# Patient Record
Sex: Female | Born: 1985 | Race: Black or African American | Hispanic: No | Marital: Married | Smoking: Current every day smoker
Health system: Southern US, Community
[De-identification: ages and names within clinical notes are randomized; demographics above are authoritative.]

## PROBLEM LIST (undated history)

## (undated) DIAGNOSIS — L309 Dermatitis, unspecified: Secondary | ICD-10-CM

## (undated) DIAGNOSIS — I10 Essential (primary) hypertension: Secondary | ICD-10-CM

## (undated) HISTORY — PX: WISDOM TOOTH EXTRACTION: SHX21

---

## 2011-12-24 ENCOUNTER — Emergency Department (HOSPITAL_BASED_OUTPATIENT_CLINIC_OR_DEPARTMENT_OTHER)
Admission: EM | Admit: 2011-12-24 | Discharge: 2011-12-24 | Disposition: A | Discharge status: Home / Self Care | Payer: BC Managed Care – PPO | Attending: Emergency Medicine | Admitting: Emergency Medicine

## 2011-12-24 ENCOUNTER — Emergency Department (HOSPITAL_BASED_OUTPATIENT_CLINIC_OR_DEPARTMENT_OTHER): Payer: BC Managed Care – PPO

## 2011-12-24 ENCOUNTER — Encounter (HOSPITAL_BASED_OUTPATIENT_CLINIC_OR_DEPARTMENT_OTHER): Payer: Self-pay | Admitting: Emergency Medicine

## 2011-12-24 DIAGNOSIS — Z3490 Encounter for supervision of normal pregnancy, unspecified, unspecified trimester: Secondary | ICD-10-CM

## 2011-12-24 DIAGNOSIS — O2690 Pregnancy related conditions, unspecified, unspecified trimester: Secondary | ICD-10-CM | POA: Insufficient documentation

## 2011-12-24 DIAGNOSIS — R1032 Left lower quadrant pain: Secondary | ICD-10-CM | POA: Insufficient documentation

## 2011-12-24 DIAGNOSIS — R109 Unspecified abdominal pain: Secondary | ICD-10-CM

## 2011-12-24 DIAGNOSIS — O210 Mild hyperemesis gravidarum: Secondary | ICD-10-CM | POA: Insufficient documentation

## 2011-12-24 HISTORY — DX: Dermatitis, unspecified: L30.9

## 2011-12-24 LAB — URINALYSIS, ROUTINE W REFLEX MICROSCOPIC
Leukocytes, UA: NEGATIVE
Nitrite: NEGATIVE
Protein, ur: NEGATIVE mg/dL
Specific Gravity, Urine: 1.02 (ref 1.005–1.030)
Urobilinogen, UA: 1 mg/dL (ref 0.0–1.0)

## 2011-12-24 LAB — CBC
MCHC: 35.8 g/dL (ref 30.0–36.0)
Platelets: 206 10*3/uL (ref 150–400)
RDW: 12.1 % (ref 11.5–15.5)
WBC: 6.1 10*3/uL (ref 4.0–10.5)

## 2011-12-24 LAB — URINE MICROSCOPIC-ADD ON

## 2011-12-24 LAB — ABO/RH
ABO/RH(D): AB NEG
Antibody Screen: NEGATIVE

## 2011-12-24 LAB — WET PREP, GENITAL

## 2011-12-24 LAB — HCG, QUANTITATIVE, PREGNANCY: hCG, Beta Chain, Quant, S: 50307 m[IU]/mL — ABNORMAL HIGH (ref <5)

## 2011-12-24 MED ORDER — ONDANSETRON 8 MG PO TBDP
8.0000 mg | ORAL_TABLET | Freq: Once | ORAL | Status: AC
  Administered 2011-12-24: 8 mg via ORAL
  Filled 2011-12-24: qty 1

## 2011-12-24 NOTE — ED Notes (Signed)
[redacted] weeks pregnant  Cramping on left side of abd

## 2011-12-24 NOTE — ED Provider Notes (Addendum)
History     CSN: [id number]  Arrival date & time 12/24/11  2028   First MD Initiated Contact with Patient 12/24/11 2056      Chief Complaint  Patient presents with  . Abdominal Cramping    (Consider location/radiation/quality/duration/timing/severity/associated sxs/prior treatment) HPI Comments: Patient presents with left lower quadrant pain that's been going on since approximately 6 AM this morning.  Patient has some associated nausea.  She has normal bowel movements, no dysuria, no hematuria and no vaginal bleeding or vaginal discharge.  Patient does note that she believes she is approximately [redacted] weeks pregnant with her last menstrual period being approximately April 15.  This is the patient's third pregnancy and she had one D&C and one carried to full term.  No prior ectopic pregnancies.  No fevers.  Patient is a 26 y.o. female presenting with cramps. The history is provided by the patient. No language interpreter was used.  Abdominal Cramping The primary symptoms of the illness include abdominal pain and nausea. The primary symptoms of the illness do not include fever, fatigue, shortness of breath, vomiting, diarrhea, hematemesis, hematochezia, dysuria, vaginal discharge or vaginal bleeding. The current episode started 13 to 24 hours ago. The onset of the illness was gradual. The problem has not changed since onset. Symptoms associated with the illness do not include chills or back pain.    Past Medical History  Diagnosis Date  . Eczema     History reviewed. No pertinent past surgical history.  History reviewed. No pertinent family history.  History  Substance Use Topics  . Smoking status: Never Smoker   . Smokeless tobacco: Not on file  . Alcohol Use: No    OB History    Grav Para Term Preterm Abortions TAB SAB Ect Mult Living   3 1   1            Review of Systems  Constitutional: Negative.  Negative for fever, chills and fatigue.  HENT: Negative.   Eyes: Negative.   Negative for discharge and redness.  Respiratory: Negative.  Negative for cough and shortness of breath.   Cardiovascular: Negative.  Negative for chest pain.  Gastrointestinal: Positive for nausea and abdominal pain. Negative for vomiting, diarrhea, hematochezia and hematemesis.  Genitourinary: Negative.  Negative for dysuria, vaginal bleeding and vaginal discharge.  Musculoskeletal: Negative.  Negative for back pain.  Skin: Negative.  Negative for color change and rash.  Neurological: Negative.  Negative for syncope and headaches.  Hematological: Negative.  Negative for adenopathy.  Psychiatric/Behavioral: Negative.  Negative for confusion.  All other systems reviewed and are negative.    Allergies  Review of patient's allergies indicates no known allergies.  Home Medications  No current outpatient prescriptions on file.  BP 139/90  Pulse 95  Temp(Src) 98.7 F (37.1 C) (Oral)  Resp 18  Ht 5\' 3"  (1.6 m)  Wt 133 lb (60.328 kg)  BMI 23.56 kg/m2  SpO2 100%  Physical Exam  Nursing note and vitals reviewed. Constitutional: She is oriented to person, place, and time. She appears well-developed and well-nourished.  Non-toxic appearance. She does not have a sickly appearance.  HENT:  Head: Normocephalic and atraumatic.  Eyes: Conjunctivae, EOM and lids are normal. Pupils are equal, round, and reactive to light. No scleral icterus.  Neck: Trachea normal and normal range of motion. Neck supple.  Cardiovascular: Normal rate, regular rhythm and normal heart sounds.   Pulmonary/Chest: Effort normal and breath sounds normal. No respiratory distress. She has no wheezes. She  has no rales.  Abdominal: Soft. Normal appearance. There is no tenderness. There is no rebound, no guarding and no CVA tenderness.  Genitourinary:       Examination chaperoned by Harvin Hazel, RN.  Normal external genitalia without lesions or rashes.  No blood present in the vaginal vault.  There is a thick white discharge  present but no signs of inflammation.   Musculoskeletal: Normal range of motion.  Neurological: She is alert and oriented to person, place, and time. She has normal strength.  Skin: Skin is warm, dry and intact. No rash noted.  Psychiatric: She has a normal mood and affect. Her behavior is normal. Judgment and thought content normal.    ED Course  Procedures (including critical care time)  Results for orders placed during the hospital encounter of 12/24/11  PREGNANCY, URINE      Component Value Range   Preg Test, Ur POSITIVE (*) NEGATIVE   URINALYSIS, ROUTINE W REFLEX MICROSCOPIC      Component Value Range   Color, Urine YELLOW  YELLOW    APPearance CLOUDY (*) CLEAR    Specific Gravity, Urine 1.020  1.005 - 1.030    pH 6.0  5.0 - 8.0    Glucose, UA NEGATIVE  NEGATIVE (mg/dL)   Hgb urine dipstick SMALL (*) NEGATIVE    Bilirubin Urine NEGATIVE  NEGATIVE    Ketones, ur NEGATIVE  NEGATIVE (mg/dL)   Protein, ur NEGATIVE  NEGATIVE (mg/dL)   Urobilinogen, UA 1.0  0.0 - 1.0 (mg/dL)   Nitrite NEGATIVE  NEGATIVE    Leukocytes, UA NEGATIVE  NEGATIVE   HCG, QUANTITATIVE, PREGNANCY      Component Value Range   hCG, Beta Chain, Quant, S 40981 (*) <5 (mIU/mL)  CBC      Component Value Range   WBC 6.1  4.0 - 10.5 (K/uL)   RBC 4.24  3.87 - 5.11 (MIL/uL)   Hemoglobin 13.4  12.0 - 15.0 (g/dL)   HCT 19.1  47.8 - 29.5 (%)   MCV 88.2  78.0 - 100.0 (fL)   MCH 31.6  26.0 - 34.0 (pg)   MCHC 35.8  30.0 - 36.0 (g/dL)   RDW 62.1  30.8 - 65.7 (%)   Platelets 206  150 - 400 (K/uL)  URINE MICROSCOPIC-ADD ON      Component Value Range   Squamous Epithelial / LPF FEW (*) RARE    RBC / HPF 3-6  <3 (RBC/hpf)   Bacteria, UA RARE  RARE    Urine-Other MUCOUS PRESENT     US Ob Comp Less 14 Wks  12/24/2011  *RADIOLOGY REPORT*  Clinical Data: Pregnant with lower abdominal pain.  OBSTETRIC <14 WK [place] AND TRANSVAGINAL OB US  Technique:  Both transabdominal and transvaginal ultrasound examinations were  performed for complete evaluation of the gestation as well as the maternal uterus, adnexal regions, and pelvic cul-de-sac.  Transvaginal technique was performed to assess early pregnancy.  Comparison:  No priors.  Intrauterine gestational sac:  A gestational sac is present in the fundal portion of the endometrial cavityand is ovoid. Yolk sac: Present. Embryo: Present. Cardiac Activity: Present. Heart Rate: 169 bpm  CRL: 16.3   mm  8   w  0   d        [place] EDC: 08/04/2012  Maternal uterus/adnexae: The right ovary measures 3.2 x 2.2 x 1.9 cm and is normal in echotexture appearance.  The left ovary measures 4.5 x 3.1 x 2.7 cm and is normal in echotexture  appearance.  No free fluid within the cul-de-sac.  IMPRESSION: 1. Single viable IUP with fetal heart rate of 169 beats per minute and estimated gestational age of approximately 8 weeks and 0 days. 2.  No acute findings.  Original Report Authenticated By: Florencia Reasons, M.D.      MDM  Patient with left lower quadrant pain with a positive pregnancy test at home.  We will perform the evaluation to rule out an ectopic pregnancy at this time.  Patient believes she may be Rh- so I also make sure to a pelvic exam to assess for signs of bleeding despite the fact that patient has not noted any bleeding.        Nat Christen, MD 12/24/11 2112  Patient with a normal IUP seen on ultrasound.  She had no history for vaginal bleeding at home and no vaginal bleeding a pelvic exam here.  Given there is no bleeding I feel patient can be discharged prior to her ABO type returning since she would not require RhoGAM.  I have instructed the patient that if she does develop bleeding she should followup with her OB/GYN.  She does have an appointment scheduled for next Friday.  Nat Christen, MD 12/24/11 [phone]
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
## 2011-12-25 LAB — GC/CHLAMYDIA PROBE AMP, GENITAL
Chlamydia, DNA Probe: NEGATIVE
GC Probe Amp, Genital: NEGATIVE

## 2012-07-03 ENCOUNTER — Encounter (HOSPITAL_BASED_OUTPATIENT_CLINIC_OR_DEPARTMENT_OTHER): Payer: Self-pay | Admitting: *Deleted

## 2012-07-03 ENCOUNTER — Emergency Department (HOSPITAL_BASED_OUTPATIENT_CLINIC_OR_DEPARTMENT_OTHER)
Admission: EM | Admit: 2012-07-03 | Discharge: 2012-07-04 | Disposition: A | Discharge status: Other Acute Inpatient Hospital | Payer: BC Managed Care – PPO | Attending: Emergency Medicine | Admitting: Emergency Medicine

## 2012-07-03 DIAGNOSIS — Z3490 Encounter for supervision of normal pregnancy, unspecified, unspecified trimester: Secondary | ICD-10-CM

## 2012-07-03 DIAGNOSIS — L259 Unspecified contact dermatitis, unspecified cause: Secondary | ICD-10-CM | POA: Insufficient documentation

## 2012-07-03 DIAGNOSIS — O4290 Premature rupture of membranes, unspecified as to length of time between rupture and onset of labor, unspecified weeks of gestation: Secondary | ICD-10-CM

## 2012-07-03 DIAGNOSIS — R109 Unspecified abdominal pain: Secondary | ICD-10-CM | POA: Insufficient documentation

## 2012-07-03 LAB — URINALYSIS, ROUTINE W REFLEX MICROSCOPIC
Glucose, UA: NEGATIVE mg/dL
Glucose, UA: NEGATIVE mg/dL
Ketones, ur: NEGATIVE mg/dL
Leukocytes, UA: NEGATIVE
Protein, ur: NEGATIVE mg/dL
Urobilinogen, UA: 1 mg/dL (ref 0.0–1.0)
pH: 7.5 (ref 5.0–8.0)

## 2012-07-03 LAB — URINE MICROSCOPIC-ADD ON

## 2012-07-03 LAB — WET PREP, GENITAL: Yeast Wet Prep HPF POC: NONE SEEN

## 2012-07-03 MED ORDER — SODIUM CHLORIDE 0.9 % IV BOLUS (SEPSIS)
500.0000 mL | Freq: Once | INTRAVENOUS | Status: AC
  Administered 2012-07-03: 500 mL via INTRAVENOUS

## 2012-07-03 MED ORDER — SODIUM CHLORIDE 0.9 % IV SOLN
INTRAVENOUS | Status: DC
  Administered 2012-07-03: 23:00:00 via INTRAVENOUS

## 2012-07-03 NOTE — ED Notes (Signed)
[facility] called for transport to [facility], RN notified.

## 2012-07-03 NOTE — Progress Notes (Signed)
Per ED RN, Pt presents to ED c/o pressure and swollen vaginal area. Pt denies UCs ut states some [employer]. Monitor in place, FHR tracing reassuring. Suggest EDP assess pt and recommend follow up to physicians in [place] where she has prenatal care if no labor.

## 2012-07-03 NOTE — ED Provider Notes (Signed)
History    This chart was scribed for Hurman Horn, MD, MD by Smitty Pluck, ED Scribe. The patient was seen in room MHT14 and the patient's care was started at 10:01PM.   CSN: [id number]  Arrival date & time 07/03/12  2138    OB [place] at [facility]    Chief Complaint  Patient presents with  . pregnant-abdominal pain     (Consider location/radiation/quality/duration/timing/severity/associated sxs/prior treatment) The history is provided by the patient. No language interpreter was used.   Gabriella Monroe is a 26 y.o. female who presents to the Emergency Department complaining of swelling and tenderness of vaginal skin with scant white discharge all day onset today. Pt is G3P1AB1 EDC 09Jan [redacted] weeks pregnant. Pt reports having constant abdominal pressure 24/7 that was been ongoing for past 2-3 weeks. She has been seen by OB and they are aware of that pressure. Pt denies vaginal bleeding, cough, chest pain, SOB, emesis, nausea, diarrhea, fever, chills, abdominal cramping or back pain and any other pain. Pt reports she is gravida 3 para 1 abortus 1.   OB is at [facility]  Past Medical History  Diagnosis Date  . Eczema     History reviewed. No pertinent past surgical history.  History reviewed. No pertinent family history.  History  Substance Use Topics  . Smoking status: Never Smoker   . Smokeless tobacco: Not on file  . Alcohol Use: No    OB History    Grav Para Term Preterm Abortions TAB SAB Ect Mult Living   3 1   1            Review of Systems 10 Systems reviewed and all are negative for acute change except as noted in the HPI.   Allergies  Review of patient's allergies indicates no known allergies.  Home Medications   Current Outpatient Rx  Name  Route  Sig  Dispense  Refill  . FLUOCINONIDE 0.05 % EX GEL   Topical   Apply 1 application topically 2 (two) times daily. Patient uses this medication for eczema. .         . TRIAMCINOLONE  ACETONIDE 0.1 % EX CREA   Topical   Apply 1 application topically 2 (two) times daily. Patient used this medication for eczema.           BP 123/82  Pulse 102  Temp 98.7 F (37.1 C) (Oral)  Resp 20  Ht 5\' 3"  (1.6 m)  Wt 156 lb (70.761 kg)  BMI 27.63 kg/m2  SpO2 100%  LMP 11/03/2011  Physical Exam  Nursing note and vitals reviewed. Constitutional:       Awake, alert, nontoxic appearance.  HENT:  Head: Atraumatic.  Eyes: Right eye exhibits no discharge. Left eye exhibits no discharge.  Neck: Neck supple.  Pulmonary/Chest: Effort normal. She exhibits no tenderness.  Abdominal: Soft. There is no tenderness. There is no rebound.  Genitourinary:       Mucosal aspect of labial folds are tender with erythema White vaginal discharge present  Chaperone present for bimanual exam Cervix is soft Cervix is closed  White discharge is positive ferning    Musculoskeletal: She exhibits no tenderness.       Baseline ROM, no obvious new focal weakness.  Neurological:       Mental status and motor strength appears baseline for patient and situation.  Skin: No rash noted.  Psychiatric: She has a normal mood and affect.    ED Course  Procedures (including critical care time) DIAGNOSTIC STUDIES: Oxygen Saturation is 100% on room air, normal by my interpretation.    COORDINATION OF CARE: 10:06 PMPatient / Family / Caregiver understand and agree with initial ED impression and plan with expectations set for ED visit.  Initial U/A contaminated so cath ordered; no obvious UTI via cath specimen.  D/w [facility] on-call for Pt at [facility], d/w [facility] ED doc at [facility], Pt accepted will reg in ED at [facility] for eval. at L&D.  [facility] L&D reported no contractions on monitor and FHR 150s.  Labs Reviewed  URINALYSIS, ROUTINE W REFLEX MICROSCOPIC - Abnormal; Notable for the following:    APPearance CLOUDY (*)     Hgb urine dipstick TRACE (*)     Leukocytes, UA MODERATE (*)     All other components within  normal limits  URINE MICROSCOPIC-ADD ON - Abnormal; Notable for the following:    Squamous Epithelial / LPF MANY (*)     Bacteria, UA MANY (*)     All other components within normal limits  WET PREP, GENITAL - Abnormal; Notable for the following:    WBC, Wet Prep HPF POC MODERATE (*)  MANY BACTERIA SEEN   All other components within normal limits  URINALYSIS, ROUTINE W REFLEX MICROSCOPIC - Abnormal; Notable for the following:    APPearance CLOUDY (*)     Hgb urine dipstick TRACE (*)     All other components within normal limits  URINE MICROSCOPIC-ADD ON - Abnormal; Notable for the following:    Squamous Epithelial / LPF FEW (*)     All other components within normal limits  URINE CULTURE   No results found.   1. PROM (premature rupture of membranes)   2. Pregnancy       MDM  The patient appears reasonably stabilized for transfer considering the current resources, flow, and capabilities available in the ED at this time, and I doubt any other [facility] requiring further screening and/or treatment in the ED prior to transfer.      I personally performed the services described in this documentation, which was scribed in my presence. The recorded information has been reviewed and is accurate.    Hurman Horn, MD 07/03/12 [phone]

## 2012-07-03 NOTE — ED Notes (Signed)
Report given to Shawn at [facility]

## 2012-07-03 NOTE — ED Notes (Addendum)
Pt is [redacted] weeks pregnant G3P1 and states she has been feeling "pressure" in her lower abd since this a.m. Area "swollen and tender to touch" Goes to [employer]

## 2012-07-03 NOTE — ED Notes (Signed)
MD at bedside. 

## 2012-07-04 NOTE — ED Notes (Addendum)
Pt removed from the fetal monitor and [facility] notified.

## 2012-07-04 NOTE — ED Notes (Signed)
NS fluids infusing at 17ml/hr on transfer to [place] [facility]

## 2012-07-04 NOTE — ED Notes (Signed)
Pt stable  when transferred to [facility]

## 2012-07-04 NOTE — ED Notes (Signed)
[facility] here to transport pt.   

## 2012-07-05 LAB — URINE CULTURE: Colony Count: 4000

## 2012-11-19 ENCOUNTER — Encounter (HOSPITAL_BASED_OUTPATIENT_CLINIC_OR_DEPARTMENT_OTHER): Payer: Self-pay | Admitting: Family Medicine

## 2012-11-19 ENCOUNTER — Emergency Department (HOSPITAL_BASED_OUTPATIENT_CLINIC_OR_DEPARTMENT_OTHER)
Admission: EM | Admit: 2012-11-19 | Discharge: 2012-11-19 | Disposition: A | Discharge status: Home / Self Care | Payer: Managed Care, Other (non HMO) | Attending: Emergency Medicine | Admitting: Emergency Medicine

## 2012-11-19 DIAGNOSIS — S0502XA Injury of conjunctiva and corneal abrasion without foreign body, left eye, initial encounter: Secondary | ICD-10-CM

## 2012-11-19 DIAGNOSIS — Y929 Unspecified place or not applicable: Secondary | ICD-10-CM | POA: Insufficient documentation

## 2012-11-19 DIAGNOSIS — Y939 Activity, unspecified: Secondary | ICD-10-CM | POA: Insufficient documentation

## 2012-11-19 DIAGNOSIS — Z872 Personal history of diseases of the skin and subcutaneous tissue: Secondary | ICD-10-CM | POA: Insufficient documentation

## 2012-11-19 DIAGNOSIS — S058X9A Other injuries of unspecified eye and orbit, initial encounter: Secondary | ICD-10-CM | POA: Insufficient documentation

## 2012-11-19 DIAGNOSIS — IMO0002 Reserved for concepts with insufficient information to code with codable children: Secondary | ICD-10-CM | POA: Insufficient documentation

## 2012-11-19 MED ORDER — TETRACAINE HCL 0.5 % OP SOLN
OPHTHALMIC | Status: AC
  Administered 2012-11-19: 10:00:00
  Filled 2012-11-19: qty 2

## 2012-11-19 MED ORDER — HYDROCODONE-ACETAMINOPHEN 5-325 MG PO TABS: 2.0000 | ORAL_TABLET | Freq: Four times a day (QID) | ORAL | Status: DC | PRN

## 2012-11-19 MED ORDER — SULFACETAMIDE SODIUM 10 % OP SOLN
2.0000 [drp] | OPHTHALMIC | Status: DC
  Administered 2012-11-19: 2 [drp] via OPHTHALMIC
  Filled 2012-11-19: qty 15

## 2012-11-19 MED ORDER — FLUORESCEIN SODIUM 1 MG OP STRP
ORAL_STRIP | OPHTHALMIC | Status: AC
  Administered 2012-11-19: 10:00:00
  Filled 2012-11-19: qty 1

## 2012-11-19 NOTE — ED Notes (Signed)
Pt sts baby "poked" her in left eye yesterday. Pt c/o left eyelid "sore" and drainage. Pt sts vision is normal.

## 2012-11-19 NOTE — ED Notes (Signed)
MD at bedside. 

## 2012-11-19 NOTE — ED Provider Notes (Signed)
History     CSN: [id number]  Arrival date & time 11/19/12  4696   First MD Initiated Contact with Patient 11/19/12 [phone]      Chief Complaint  Patient presents with  . Eye Injury    (Consider location/radiation/quality/duration/timing/severity/associated sxs/prior treatment) Patient is a 28 y.o. female presenting with eye injury.  Eye Injury   Pt reports yesterday evening her infant poked her in the left eye. She has had moderate aching pain under the eye lid since then, worse with blinking and clear drainage. No pus, no blurry vision.   Past Medical History  Diagnosis Date  . Eczema     History reviewed. No pertinent past surgical history.  No family history on file.  History  Substance Use Topics  . Smoking status: Never Smoker   . Smokeless tobacco: Not on file  . Alcohol Use: No    OB History   Grav Para Term Preterm Abortions TAB SAB Ect Mult Living   3 1   1            Review of Systems All other systems reviewed and are negative except as noted in HPI.   Allergies  Review of patient's allergies indicates no known allergies.  Home Medications   Current Outpatient Rx  Name  Route  Sig  Dispense  Refill  . fluocinonide gel (LIDEX) 0.05 %   Topical   Apply 1 application topically 2 (two) times daily. Patient uses this medication for eczema. .         . triamcinolone cream (KENALOG) 0.1 %   Topical   Apply 1 application topically 2 (two) times daily. Patient used this medication for eczema.           BP 136/89  Pulse 91  Temp(Src) 98.1 F (36.7 C) (Oral)  Resp 18  LMP 11/02/2012  Breastfeeding? Unknown  Physical Exam  Nursing note and vitals reviewed. Constitutional: She is oriented to person, place, and time. She appears well-developed and well-nourished.  HENT:  Head: Normocephalic and atraumatic.  Eyes: Conjunctivae and EOM are normal. Pupils are equal, round, and reactive to light. Right eye exhibits no discharge. Left eye exhibits  no discharge.  No pain with consensual pupillary response, anterior chambers clear. There is a small corneal abrasion on the left at the 3 o'clock position  Neck: Normal range of motion. Neck supple.  Cardiovascular: Normal rate, normal heart sounds and intact distal pulses.   Pulmonary/Chest: Effort normal and breath sounds normal.  Abdominal: Bowel sounds are normal. She exhibits no distension. There is no tenderness.  Musculoskeletal: Normal range of motion. She exhibits no edema and no tenderness.  Neurological: She is alert and oriented to person, place, and time. She has normal strength. No cranial nerve deficit or sensory deficit.  Skin: Skin is warm and dry. No rash noted.  Psychiatric: She has a normal mood and affect.    ED Course  Procedures (including critical care time)  Labs Reviewed - No data to display No results found.   1. Corneal abrasion, left, initial encounter       MDM  Abx drops, pain meds for small uncomplicated corneal abrasion.         Keyerra Lamere B. Bernette Mayers, MD 11/19/12 [phone]

## 2013-03-31 ENCOUNTER — Encounter (HOSPITAL_BASED_OUTPATIENT_CLINIC_OR_DEPARTMENT_OTHER): Payer: Self-pay | Admitting: *Deleted

## 2013-03-31 ENCOUNTER — Emergency Department (HOSPITAL_BASED_OUTPATIENT_CLINIC_OR_DEPARTMENT_OTHER)
Admission: EM | Admit: 2013-03-31 | Discharge: 2013-03-31 | Disposition: A | Discharge status: Home / Self Care | Payer: Managed Care, Other (non HMO) | Attending: Emergency Medicine | Admitting: Emergency Medicine

## 2013-03-31 ENCOUNTER — Emergency Department (HOSPITAL_BASED_OUTPATIENT_CLINIC_OR_DEPARTMENT_OTHER): Payer: Managed Care, Other (non HMO)

## 2013-03-31 DIAGNOSIS — R071 Chest pain on breathing: Secondary | ICD-10-CM | POA: Insufficient documentation

## 2013-03-31 DIAGNOSIS — R0789 Other chest pain: Secondary | ICD-10-CM

## 2013-03-31 DIAGNOSIS — Z79899 Other long term (current) drug therapy: Secondary | ICD-10-CM | POA: Insufficient documentation

## 2013-03-31 DIAGNOSIS — Z872 Personal history of diseases of the skin and subcutaneous tissue: Secondary | ICD-10-CM | POA: Insufficient documentation

## 2013-03-31 LAB — BASIC METABOLIC PANEL
BUN: 13 mg/dL (ref 6–23)
CO2: 27 mEq/L (ref 19–32)
Chloride: 104 mEq/L (ref 96–112)
Glucose, Bld: 128 mg/dL — ABNORMAL HIGH (ref 70–99)
Potassium: 3.5 mEq/L (ref 3.5–5.1)

## 2013-03-31 LAB — CBC WITH DIFFERENTIAL/PLATELET
HCT: 39.2 % (ref 36.0–46.0)
Hemoglobin: 13.4 g/dL (ref 12.0–15.0)
Lymphs Abs: 1.4 10*3/uL (ref 0.7–4.0)
MCH: 31.6 pg (ref 26.0–34.0)
Monocytes Relative: 8 % (ref 3–12)
Neutro Abs: 2.2 10*3/uL (ref 1.7–7.7)
Neutrophils Relative %: 55 % (ref 43–77)
RBC: 4.24 MIL/uL (ref 3.87–5.11)

## 2013-03-31 MED ORDER — TRAMADOL HCL 50 MG PO TABS: 50.0000 mg | ORAL_TABLET | Freq: Four times a day (QID) | ORAL | Status: DC | PRN

## 2013-03-31 NOTE — ED Provider Notes (Signed)
CSN: [id number]     Arrival date & time 03/31/13  1854 History   First MD Initiated Contact with Patient 03/31/13 1914     Chief Complaint  Patient presents with  . Chest Pain   (Consider location/radiation/quality/duration/timing/severity/associated sxs/prior Treatment) HPI Comments: Pt states that she has been having substernal pain for the last 4 days:pt states that she figured it was related to the heavy lifting she does at her job, but when [employer] didn't help she decided to come have it evaluated:denies fever cough, vomiting:pt states that she quit smoking when all this started  Patient is a 27 y.o. female presenting with chest pain. The history is provided by the patient. No language interpreter was used.  Chest Pain Pain location:  Substernal area Pain quality: aching   Pain radiates to:  Does not radiate Pain radiates to the back: no   Pain severity:  Mild Onset quality:  Gradual Timing:  Constant Progression:  Unchanged Chronicity:  New Relieved by:  Nothing Worsened by:  Nothing tried Associated symptoms: no shortness of breath     Past Medical History  Diagnosis Date  . Eczema    History reviewed. No pertinent past surgical history. No family history on file. History  Substance Use Topics  . Smoking status: Never Smoker   . Smokeless tobacco: Not on file  . Alcohol Use: No   OB History   Grav Para Term Preterm Abortions TAB SAB Ect Mult Living   3 1   1           Review of Systems  Constitutional: Negative.   Respiratory: Negative for shortness of breath.   Cardiovascular: Positive for chest pain.    Allergies  Review of patient's allergies indicates no known allergies.  Home Medications   Current Outpatient Rx  Name  Route  Sig  Dispense  Refill  . fluocinonide gel (LIDEX) 0.05 %   Topical   Apply 1 application topically 2 (two) times daily. Patient uses this medication for eczema. .         . HYDROcodone-acetaminophen (NORCO/VICODIN) 5-325 MG  per tablet   Oral   Take 2 tablets by mouth every 6 (six) hours as needed for pain.   30 tablet   0   . triamcinolone cream (KENALOG) 0.1 %   Topical   Apply 1 application topically 2 (two) times daily. Patient used this medication for eczema.          BP 142/94  Pulse 88  Temp(Src) 98.2 F (36.8 C) (Oral)  Resp 18  Ht 5\' 3"  (1.6 m)  Wt 146 lb (66.225 kg)  BMI 25.87 kg/m2  SpO2 98%  LMP 03/29/2013 Physical Exam  Constitutional: She is oriented to person, place, and time. She appears well-developed and well-nourished.  HENT:  Head: Normocephalic and atraumatic.  Eyes: Conjunctivae and EOM are normal. Pupils are equal, round, and reactive to light.  Neck: Normal range of motion. Neck supple.  Cardiovascular: Regular rhythm.   Pulmonary/Chest: Effort normal and breath sounds normal. She exhibits no tenderness.  Musculoskeletal: Normal range of motion.  Neurological: She is alert and oriented to person, place, and time.  Skin: Skin is warm and dry.    ED Course  Procedures (including critical care time) Labs Review Labs Reviewed  BASIC METABOLIC PANEL - Abnormal; Notable for the following:    Glucose, Bld 128 (*)    All other components within normal limits  CBC WITH DIFFERENTIAL    Date: 03/31/2013  Rate: 91  Rhythm: normal sinus rhythm  QRS Axis: normal  Intervals: normal  ST/T Wave abnormalities: normal  Conduction Disutrbances:none  Narrative Interpretation:   Old EKG Reviewed: none available   Imaging Review Dg Chest 2 View  03/31/2013   *RADIOLOGY REPORT*  Clinical Data: Dull chest pain for 5 days  CHEST - 2 VIEW  Comparison: None.  Findings:  Normal cardiac silhouette and mediastinal contours.  No focal parenchymal opacity.  No pleural effusion or pneumothorax.  No evidence of edema.  No acute osseous abnormality. There is mild scoliotic curvature of the thoracolumbar spine, possibly positional.  IMPRESSION: No acute cardiopulmonary disease.   Original  Report Authenticated By: Tacey Ruiz, MD    MDM   1. Chest wall pain    Doubt cardiac in nature;vital area stable doubt pe:no infection noted on x-ray:pt is okay to follow up with pcp    Teressa Lower, NP 03/31/13 2028

## 2013-03-31 NOTE — ED Provider Notes (Signed)
Medical screening examination/treatment/procedure(s) were performed by non-physician practitioner and as supervising physician I was immediately available for consultation/collaboration.   Angellina Ferdinand, MD 03/31/13 2235 

## 2013-03-31 NOTE — ED Notes (Signed)
Pt placed on heart monitor.

## 2013-03-31 NOTE — ED Notes (Signed)
Pt reports dull ache to centralized chest since Sunday, originally thought it was work related, she works at [employer] and thought pain was from lifting, describes as dull ache, no radiation, no sob, no diaphoresis, + nausea,

## 2013-03-31 NOTE — ED Notes (Signed)
Dull pain in her chest x 4 days. Has been lifting at work. No relief with Aleve.

## 2014-02-05 ENCOUNTER — Encounter (HOSPITAL_BASED_OUTPATIENT_CLINIC_OR_DEPARTMENT_OTHER): Payer: Self-pay | Admitting: Emergency Medicine

## 2014-02-05 ENCOUNTER — Emergency Department (HOSPITAL_BASED_OUTPATIENT_CLINIC_OR_DEPARTMENT_OTHER)
Admission: EM | Admit: 2014-02-05 | Discharge: 2014-02-05 | Disposition: A | Discharge status: Home / Self Care | Payer: Managed Care, Other (non HMO) | Attending: Emergency Medicine | Admitting: Emergency Medicine

## 2014-02-05 DIAGNOSIS — IMO0001 Reserved for inherently not codable concepts without codable children: Secondary | ICD-10-CM

## 2014-02-05 DIAGNOSIS — R030 Elevated blood-pressure reading, without diagnosis of hypertension: Secondary | ICD-10-CM | POA: Insufficient documentation

## 2014-02-05 DIAGNOSIS — B9689 Other specified bacterial agents as the cause of diseases classified elsewhere: Secondary | ICD-10-CM | POA: Insufficient documentation

## 2014-02-05 DIAGNOSIS — Z872 Personal history of diseases of the skin and subcutaneous tissue: Secondary | ICD-10-CM | POA: Insufficient documentation

## 2014-02-05 DIAGNOSIS — Z3202 Encounter for pregnancy test, result negative: Secondary | ICD-10-CM | POA: Insufficient documentation

## 2014-02-05 DIAGNOSIS — A499 Bacterial infection, unspecified: Secondary | ICD-10-CM | POA: Insufficient documentation

## 2014-02-05 DIAGNOSIS — IMO0002 Reserved for concepts with insufficient information to code with codable children: Secondary | ICD-10-CM | POA: Insufficient documentation

## 2014-02-05 DIAGNOSIS — N760 Acute vaginitis: Secondary | ICD-10-CM | POA: Insufficient documentation

## 2014-02-05 LAB — CBC WITH DIFFERENTIAL/PLATELET
BASOS PCT: 1 % (ref 0–1)
Basophils Absolute: 0 10*3/uL (ref 0.0–0.1)
EOS ABS: 0.3 10*3/uL (ref 0.0–0.7)
Eosinophils Relative: 8 % — ABNORMAL HIGH (ref 0–5)
HEMATOCRIT: 41.1 % (ref 36.0–46.0)
HEMOGLOBIN: 14.2 g/dL (ref 12.0–15.0)
LYMPHS ABS: 1.4 10*3/uL (ref 0.7–4.0)
Lymphocytes Relative: 33 % (ref 12–46)
MCH: 31.8 pg (ref 26.0–34.0)
MCHC: 34.5 g/dL (ref 30.0–36.0)
MCV: 91.9 fL (ref 78.0–100.0)
MONO ABS: 0.3 10*3/uL (ref 0.1–1.0)
MONOS PCT: 8 % (ref 3–12)
NEUTROS ABS: 2.2 10*3/uL (ref 1.7–7.7)
NEUTROS PCT: 51 % (ref 43–77)
Platelets: 214 10*3/uL (ref 150–400)
RBC: 4.47 MIL/uL (ref 3.87–5.11)
RDW: 12.8 % (ref 11.5–15.5)
WBC: 4.2 10*3/uL (ref 4.0–10.5)

## 2014-02-05 LAB — CREATININE, SERUM
CREATININE: 0.8 mg/dL (ref 0.50–1.10)
GFR calc Af Amer: >90 mL/min (ref >90)
GFR calc non Af Amer: >90 mL/min (ref >90)

## 2014-02-05 LAB — URINALYSIS, ROUTINE W REFLEX MICROSCOPIC
BILIRUBIN URINE: NEGATIVE
Glucose, UA: NEGATIVE mg/dL
KETONES UR: NEGATIVE mg/dL
LEUKOCYTES UA: NEGATIVE
NITRITE: NEGATIVE
PROTEIN: NEGATIVE mg/dL
Specific Gravity, Urine: 1.026 (ref 1.005–1.030)
Urobilinogen, UA: 1 mg/dL (ref 0.0–1.0)
pH: 6.5 (ref 5.0–8.0)

## 2014-02-05 LAB — URINE MICROSCOPIC-ADD ON

## 2014-02-05 LAB — WET PREP, GENITAL
Trich, Wet Prep: NONE SEEN
YEAST WET PREP: NONE SEEN

## 2014-02-05 LAB — PREGNANCY, URINE: PREG TEST UR: NEGATIVE

## 2014-02-05 MED ORDER — HYDROCHLOROTHIAZIDE 12.5 MG PO TABS: 12.5000 mg | ORAL_TABLET | Freq: Every day | ORAL | Status: DC

## 2014-02-05 MED ORDER — METRONIDAZOLE 500 MG PO TABS: 500.0000 mg | ORAL_TABLET | Freq: Two times a day (BID) | ORAL | Status: DC

## 2014-02-05 MED ORDER — METRONIDAZOLE 500 MG PO TABS
500.0000 mg | ORAL_TABLET | Freq: Once | ORAL | Status: AC
  Administered 2014-02-05: 500 mg via ORAL
  Filled 2014-02-05: qty 1

## 2014-02-05 MED ORDER — VERAPAMIL HCL 80 MG PO TABS: 80.0000 mg | ORAL_TABLET | Freq: Three times a day (TID) | ORAL | Status: DC

## 2014-02-05 NOTE — ED Notes (Addendum)
Pt presents to ED with complaints of vaginal discharge for 3 weeks, pt describes discharge as brownish and has an odor and itches. .  Pt also wants something for high blood pressure, states that she has had HTN but no one has ever given her anything for it.

## 2014-02-05 NOTE — Discharge Instructions (Signed)
Do not drink alcohol while you are taking flagyl (metronidazole) because it will make you very sick.  Take your antibiotics as directed and to completion. You should never have any leftover antibiotics! Push fluids and stay well hydrated.   Please follow with your primary care doctor in the next 5 days for high blood pressure evaluation. If you do not have a primary care doctor, present to urgent care. Reduce salt intake. Seek emergency medical care for unilateral weakness, slurring, change in vision, or chest pain and shortness of breath.  Do not hesitate to return to the Emergency Department for any new, worsening or concerning symptoms.   If you do not have a primary care doctor you can establish one at the   [facility]: [address] [place] [address] [phone]  After you establish care. Let them know you were seen in the emergency room. They must obtain records for further management.    Bacterial Vaginosis Bacterial vaginosis is a vaginal infection that occurs when the normal balance of bacteria in the vagina is disrupted. It results from an overgrowth of certain bacteria. This is the most common vaginal infection in women of childbearing age. Treatment is important to prevent complications, especially in pregnant women, as it can cause a premature delivery. CAUSES  Bacterial vaginosis is caused by an increase in harmful bacteria that are normally present in smaller amounts in the vagina. Several different kinds of bacteria can cause bacterial vaginosis. However, the reason that the condition develops is not fully understood. RISK FACTORS Certain activities or behaviors can put you at an increased risk of developing bacterial vaginosis, including:  Having a new sex partner or multiple sex partners.  Douching.  Using an intrauterine device (IUD) for contraception. Women do not get bacterial vaginosis from toilet seats, bedding, swimming pools, or contact  with objects around them. SIGNS AND SYMPTOMS  Some women with bacterial vaginosis have no signs or symptoms. Common symptoms include:  Grey vaginal discharge.  A fishlike odor with discharge, especially after sexual intercourse.  Itching or burning of the vagina and vulva.  Burning or pain with urination. DIAGNOSIS  Your health care provider will take a medical history and examine the vagina for signs of bacterial vaginosis. A sample of vaginal fluid may be taken. Your health care provider will look at this sample under a microscope to check for bacteria and abnormal cells. A vaginal pH test may also be done.  TREATMENT  Bacterial vaginosis may be treated with antibiotic medicines. These may be given in the form of a pill or a vaginal cream. A second round of antibiotics may be prescribed if the condition comes back after treatment.  HOME CARE INSTRUCTIONS   Only take over-the-counter or prescription medicines as directed by your health care provider.  If antibiotic medicine was prescribed, take it as directed. Make sure you finish it even if you start to feel better.  Do not have sex until treatment is completed.  Tell all sexual partners that you have a vaginal infection. They should see their health care provider and be treated if they have problems, such as a mild rash or itching.  Practice safe sex by using condoms and only having one sex partner. SEEK MEDICAL CARE IF:   Your symptoms are not improving after 3 days of treatment.  You have increased discharge or pain.  You have a fever. MAKE SURE YOU:   Understand these instructions.  Will watch your condition.  Will  get help right away if you are not doing well or get worse. FOR MORE INFORMATION  Centers for Disease Control and Prevention, Division of STD Prevention: SolutionApps.co.zawww.cdc.gov/std American Sexual Health Association (ASHA): www.ashastd.org  Document Released: 07/07/2005 Document Revised: 04/27/2013 Document Reviewed:  02/16/2013 [facility] Patient Information 2015 [employer], [place]. This information is not intended to replace advice given to you by your health care provider. Make sure you discuss any questions you have with your health care provider.

## 2014-02-05 NOTE — ED Notes (Signed)
Pelvic cart set up at pt bedside. 

## 2014-02-05 NOTE — ED Provider Notes (Signed)
CSN: [id number]     Arrival date & time 02/05/14  1319 History   First MD Initiated Contact with Patient 02/05/14 1458     Chief Complaint  Patient presents with  . Vaginal Discharge     (Consider location/radiation/quality/duration/timing/severity/associated sxs/prior Treatment) HPI  Gabriella Monroe is a 28 y.o. female complaining of vaginal itching and discharge described as thick, creamy white onset on the Fourth of July. Patient states that the discharge has changed to brown, watery and foul-smelling over the last week, itching has resolved. Patient denies any unprotected sex recently, she's unconcerned about STDs. She denies fever, chills, rash, lesion, abdominal pain, nausea vomiting, change in bowel or bladder habits. She primary care physician. She has been told that she has elevated blood pressure the been put on medication. Hasn't periodically herself at the drug store and is always high. She is an extensive family history of hypertension. She denies any chest pain, shortness of breath, headache, change in vision.   Past Medical History  Diagnosis Date  . Eczema    History reviewed. No pertinent past surgical history. History reviewed. No pertinent family history. History  Substance Use Topics  . Smoking status: Never Smoker   . Smokeless tobacco: Not on file  . Alcohol Use: No   OB History   Grav Para Term Preterm Abortions TAB SAB Ect Mult Living   3 1   1           Review of Systems  10 systems reviewed and found to be negative, except as noted in the HPI.   Allergies  Review of patient's allergies indicates no known allergies.  Home Medications   Prior to Admission medications   Medication Sig Start Date End Date Taking? Authorizing Provider  fluocinonide gel (LIDEX) 0.05 % Apply 1 application topically 2 (two) times daily. Patient uses this medication for eczema. .    Historical Provider, MD  hydrochlorothiazide (HYDRODIURIL) 12.5 MG tablet Take 1 tablet (12.5  mg total) by mouth daily. 02/05/14   Teo Moede, PA-C  HYDROcodone-acetaminophen (NORCO/VICODIN) 5-325 MG per tablet Take 2 tablets by mouth every 6 (six) hours as needed for pain. 11/19/12   Charles B. Bernette Mayers, MD  metroNIDAZOLE (FLAGYL) 500 MG tablet Take 1 tablet (500 mg total) by mouth 2 (two) times daily. 02/05/14   Mariel Gaudin, PA-C  traMADol (ULTRAM) 50 MG tablet Take 1 tablet (50 mg total) by mouth every 6 (six) hours as needed for pain. 03/31/13   Teressa Lower, NP  triamcinolone cream (KENALOG) 0.1 % Apply 1 application topically 2 (two) times daily. Patient used this medication for eczema.    Historical Provider, MD  verapamil (CALAN) 80 MG tablet Take 1 tablet (80 mg total) by mouth 3 (three) times daily. 02/05/14   Navie Lamoreaux, PA-C   BP 155/102  Pulse 91  Temp(Src) 98.8 F (37.1 C) (Oral)  Resp 16  Ht 5\' 3"  (1.6 m)  Wt 148 lb (67.132 kg)  BMI 26.22 kg/m2  SpO2 100%  LMP 01/18/2014 Physical Exam  Nursing note and vitals reviewed. Constitutional: She is oriented to person, place, and time. She appears well-developed and well-nourished. No distress.  HENT:  Head: Normocephalic and atraumatic.  Mouth/Throat: Oropharynx is clear and moist.  Eyes: Conjunctivae and EOM are normal. Pupils are equal, round, and reactive to light.  Cardiovascular: Normal rate, regular rhythm and intact distal pulses.   Pulmonary/Chest: Effort normal and breath sounds normal. No stridor. No respiratory distress. She has no wheezes. She has  no rales. She exhibits no tenderness.  Abdominal: Soft. Bowel sounds are normal. She exhibits no distension and no mass. There is no tenderness. There is no rebound and no guarding.  Genitourinary:  Pelvic exam chaperoned by technician: No rashes or lesions, there is a thick, opaque, non-foul-smelling, profuse discharge. There is no cervical motion or adnexal tenderness.  Musculoskeletal: Normal range of motion. She exhibits no edema and no tenderness.   Neurological: She is alert and oriented to person, place, and time.  Psychiatric: She has a normal mood and affect.    ED Course  Procedures (including critical care time) Labs Review Labs Reviewed  WET PREP, GENITAL - Abnormal; Notable for the following:    Clue Cells Wet Prep HPF POC TOO NUMEROUS TO COUNT (*)    WBC, Wet Prep HPF POC MANY (*)    All other components within normal limits  URINALYSIS, ROUTINE W REFLEX MICROSCOPIC - Abnormal; Notable for the following:    Hgb urine dipstick MODERATE (*)    All other components within normal limits  CBC WITH DIFFERENTIAL - Abnormal; Notable for the following:    Eosinophils Relative 8 (*)    All other components within normal limits  GC/CHLAMYDIA PROBE AMP  PREGNANCY, URINE  URINE MICROSCOPIC-ADD ON  CREATININE, SERUM    Imaging Review No results found.   EKG Interpretation None      MDM   Final diagnoses:  Elevated blood pressure  Bacterial vaginosis    Filed Vitals:   02/05/14 1324 02/05/14 1329 02/05/14 1540  BP: 158/113 172/110 155/102  Pulse: 99  91  Temp: 98.8 F (37.1 C)    TempSrc: Oral    Resp: 14  16  Height: 5\' 3"  (1.6 m)    Weight: 148 lb (67.132 kg)    SpO2: 100%  100%    Medications  metroNIDAZOLE (FLAGYL) tablet 500 mg (500 mg Oral Given 02/05/14 1621)    Gabriella Monroe is a 28 y.o. female presenting with vaginal discharge changing over the course of 3 weeks. Patient is found to have significantly elevated blood pressure at 172/110. This is asymptomatic with no signs of secondary organ involvement. Patient has hemoglobin on her urinalysis. She has an extensive family history of hypertension. No signs of endorgan damage. Creatinine check pending.  Creatinine normal. Will start patient on verapamil and hydrochlorothiazide which are both on the $4 list. Wet prep shows clue cells. Will also start on Flagyl for bacterial vaginosis. He had an extensive discussion on importance of following with primary  care for evaluation of high blood pressure. I advised her that she cannot get into primary care for lack of insurance she can return to the ED for refill of high blood pressure medications.  Evaluation does not show pathology that would require ongoing emergent intervention or inpatient treatment. Pt is hemodynamically stable and mentating appropriately. Discussed findings and plan with patient/guardian, who agrees with care plan. All questions answered. Return precautions discussed and outpatient follow up given.   Discharge Medication List as of 02/05/2014  4:19 PM    START taking these medications   Details  hydrochlorothiazide (HYDRODIURIL) 12.5 MG tablet Take 1 tablet (12.5 mg total) by mouth daily., Starting 02/05/2014, Until Discontinued, Print    metroNIDAZOLE (FLAGYL) 500 MG tablet Take 1 tablet (500 mg total) by mouth 2 (two) times daily., Starting 02/05/2014, Until Discontinued, Print    verapamil (CALAN) 80 MG tablet Take 1 tablet (80 mg total) by mouth 3 (three) times daily., Starting  02/05/2014, Until Discontinued, Print             Gabriella Emeryicole Laurelle Skiver, PA-C 02/05/14 1926

## 2014-02-06 LAB — GC/CHLAMYDIA PROBE AMP
CT Probe RNA: NEGATIVE
GC PROBE AMP APTIMA: NEGATIVE

## 2014-02-06 NOTE — ED Provider Notes (Signed)
Medical screening examination/treatment/procedure(s) were performed by non-physician practitioner and as supervising physician I was immediately available for consultation/collaboration.   EKG Interpretation None       Hurman HornJohn M Larue Drawdy, MD 02/06/14 2144

## 2014-05-22 ENCOUNTER — Encounter (HOSPITAL_BASED_OUTPATIENT_CLINIC_OR_DEPARTMENT_OTHER): Payer: Self-pay | Admitting: Emergency Medicine

## 2014-09-22 ENCOUNTER — Emergency Department (HOSPITAL_BASED_OUTPATIENT_CLINIC_OR_DEPARTMENT_OTHER): Payer: Managed Care, Other (non HMO)

## 2014-09-22 ENCOUNTER — Encounter (HOSPITAL_BASED_OUTPATIENT_CLINIC_OR_DEPARTMENT_OTHER): Payer: Self-pay

## 2014-09-22 ENCOUNTER — Emergency Department (HOSPITAL_BASED_OUTPATIENT_CLINIC_OR_DEPARTMENT_OTHER)
Admission: EM | Admit: 2014-09-22 | Discharge: 2014-09-22 | Disposition: A | Discharge status: Home / Self Care | Payer: Managed Care, Other (non HMO) | Attending: Emergency Medicine | Admitting: Emergency Medicine

## 2014-09-22 DIAGNOSIS — Y92410 Unspecified street and highway as the place of occurrence of the external cause: Secondary | ICD-10-CM | POA: Diagnosis not present

## 2014-09-22 DIAGNOSIS — Y9389 Activity, other specified: Secondary | ICD-10-CM | POA: Diagnosis not present

## 2014-09-22 DIAGNOSIS — S40022A Contusion of left upper arm, initial encounter: Secondary | ICD-10-CM | POA: Diagnosis not present

## 2014-09-22 DIAGNOSIS — T148XXA Other injury of unspecified body region, initial encounter: Secondary | ICD-10-CM

## 2014-09-22 DIAGNOSIS — Z720 Tobacco use: Secondary | ICD-10-CM | POA: Diagnosis not present

## 2014-09-22 DIAGNOSIS — Z791 Long term (current) use of non-steroidal anti-inflammatories (NSAID): Secondary | ICD-10-CM | POA: Diagnosis not present

## 2014-09-22 DIAGNOSIS — Z792 Long term (current) use of antibiotics: Secondary | ICD-10-CM | POA: Insufficient documentation

## 2014-09-22 DIAGNOSIS — Z79899 Other long term (current) drug therapy: Secondary | ICD-10-CM | POA: Diagnosis not present

## 2014-09-22 DIAGNOSIS — Z7952 Long term (current) use of systemic steroids: Secondary | ICD-10-CM | POA: Diagnosis not present

## 2014-09-22 DIAGNOSIS — I10 Essential (primary) hypertension: Secondary | ICD-10-CM | POA: Insufficient documentation

## 2014-09-22 DIAGNOSIS — S8992XA Unspecified injury of left lower leg, initial encounter: Secondary | ICD-10-CM | POA: Diagnosis present

## 2014-09-22 DIAGNOSIS — Y998 Other external cause status: Secondary | ICD-10-CM | POA: Diagnosis not present

## 2014-09-22 DIAGNOSIS — Z3202 Encounter for pregnancy test, result negative: Secondary | ICD-10-CM | POA: Diagnosis not present

## 2014-09-22 DIAGNOSIS — Z872 Personal history of diseases of the skin and subcutaneous tissue: Secondary | ICD-10-CM | POA: Diagnosis not present

## 2014-09-22 DIAGNOSIS — S8002XA Contusion of left knee, initial encounter: Secondary | ICD-10-CM | POA: Diagnosis not present

## 2014-09-22 HISTORY — DX: Essential (primary) hypertension: I10

## 2014-09-22 LAB — URINALYSIS, ROUTINE W REFLEX MICROSCOPIC
BILIRUBIN URINE: NEGATIVE
GLUCOSE, UA: NEGATIVE mg/dL
Ketones, ur: NEGATIVE mg/dL
LEUKOCYTES UA: NEGATIVE
Nitrite: NEGATIVE
PH: 5.5 (ref 5.0–8.0)
Protein, ur: NEGATIVE mg/dL
SPECIFIC GRAVITY, URINE: 1.015 (ref 1.005–1.030)
Urobilinogen, UA: 1 mg/dL (ref 0.0–1.0)

## 2014-09-22 LAB — URINE MICROSCOPIC-ADD ON

## 2014-09-22 LAB — PREGNANCY, URINE: Preg Test, Ur: NEGATIVE

## 2014-09-22 MED ORDER — CYCLOBENZAPRINE HCL 10 MG PO TABS: 10.0000 mg | ORAL_TABLET | Freq: Two times a day (BID) | ORAL | Status: DC | PRN

## 2014-09-22 MED ORDER — NAPROXEN 500 MG PO TABS: 500.0000 mg | ORAL_TABLET | Freq: Two times a day (BID) | ORAL | Status: DC
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
## 2014-11-28 ENCOUNTER — Emergency Department (HOSPITAL_BASED_OUTPATIENT_CLINIC_OR_DEPARTMENT_OTHER): Payer: Managed Care, Other (non HMO)

## 2014-11-28 ENCOUNTER — Emergency Department (HOSPITAL_BASED_OUTPATIENT_CLINIC_OR_DEPARTMENT_OTHER)
Admission: EM | Admit: 2014-11-28 | Discharge: 2014-11-29 | Disposition: A | Discharge status: Home / Self Care | Payer: Managed Care, Other (non HMO) | Attending: Emergency Medicine | Admitting: Emergency Medicine

## 2014-11-28 ENCOUNTER — Encounter (HOSPITAL_BASED_OUTPATIENT_CLINIC_OR_DEPARTMENT_OTHER): Payer: Self-pay | Admitting: *Deleted

## 2014-11-28 DIAGNOSIS — R05 Cough: Secondary | ICD-10-CM | POA: Diagnosis not present

## 2014-11-28 DIAGNOSIS — Z872 Personal history of diseases of the skin and subcutaneous tissue: Secondary | ICD-10-CM | POA: Diagnosis not present

## 2014-11-28 DIAGNOSIS — R0781 Pleurodynia: Secondary | ICD-10-CM | POA: Insufficient documentation

## 2014-11-28 DIAGNOSIS — Z79899 Other long term (current) drug therapy: Secondary | ICD-10-CM | POA: Insufficient documentation

## 2014-11-28 DIAGNOSIS — I10 Essential (primary) hypertension: Secondary | ICD-10-CM | POA: Diagnosis not present

## 2014-11-28 DIAGNOSIS — Z720 Tobacco use: Secondary | ICD-10-CM | POA: Insufficient documentation

## 2014-11-28 NOTE — ED Notes (Signed)
Pain in her right ribs after having a cough x 2 weeks.

## 2014-11-29 MED ORDER — NAPROXEN SODIUM 550 MG PO TABS: ORAL_TABLET | ORAL | Status: DC
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
## 2015-08-13 ENCOUNTER — Emergency Department (HOSPITAL_BASED_OUTPATIENT_CLINIC_OR_DEPARTMENT_OTHER)
Admission: EM | Admit: 2015-08-13 | Discharge: 2015-08-13 | Disposition: A | Discharge status: Home / Self Care | Payer: Managed Care, Other (non HMO) | Attending: Emergency Medicine | Admitting: Emergency Medicine

## 2015-08-13 ENCOUNTER — Encounter (HOSPITAL_BASED_OUTPATIENT_CLINIC_OR_DEPARTMENT_OTHER): Payer: Self-pay

## 2015-08-13 DIAGNOSIS — Z3202 Encounter for pregnancy test, result negative: Secondary | ICD-10-CM | POA: Diagnosis not present

## 2015-08-13 DIAGNOSIS — Z87891 Personal history of nicotine dependence: Secondary | ICD-10-CM | POA: Diagnosis not present

## 2015-08-13 DIAGNOSIS — Z872 Personal history of diseases of the skin and subcutaneous tissue: Secondary | ICD-10-CM | POA: Diagnosis not present

## 2015-08-13 DIAGNOSIS — R1110 Vomiting, unspecified: Secondary | ICD-10-CM

## 2015-08-13 DIAGNOSIS — R112 Nausea with vomiting, unspecified: Secondary | ICD-10-CM | POA: Insufficient documentation

## 2015-08-13 DIAGNOSIS — R197 Diarrhea, unspecified: Secondary | ICD-10-CM | POA: Insufficient documentation

## 2015-08-13 DIAGNOSIS — I10 Essential (primary) hypertension: Secondary | ICD-10-CM | POA: Diagnosis not present

## 2015-08-13 DIAGNOSIS — R000 Tachycardia, unspecified: Secondary | ICD-10-CM | POA: Insufficient documentation

## 2015-08-13 LAB — COMPREHENSIVE METABOLIC PANEL
ALT: 26 U/L (ref 14–54)
AST: 19 U/L (ref 15–41)
Albumin: 3.9 g/dL (ref 3.5–5.0)
Alkaline Phosphatase: 30 U/L — ABNORMAL LOW (ref 38–126)
Anion gap: 8 (ref 5–15)
BILIRUBIN TOTAL: 0.4 mg/dL (ref 0.3–1.2)
BUN: 10 mg/dL (ref 6–20)
CHLORIDE: 106 mmol/L (ref 101–111)
CO2: 24 mmol/L (ref 22–32)
CREATININE: 0.73 mg/dL (ref 0.44–1.00)
Calcium: 8.9 mg/dL (ref 8.9–10.3)
GFR calc Af Amer: >60 mL/min (ref >60)
GLUCOSE: 90 mg/dL (ref 65–99)
Potassium: 3.8 mmol/L (ref 3.5–5.1)
Sodium: 138 mmol/L (ref 135–145)
Total Protein: 6.9 g/dL (ref 6.5–8.1)

## 2015-08-13 LAB — CBC WITH DIFFERENTIAL/PLATELET
BASOS ABS: 0 10*3/uL (ref 0.0–0.1)
BLASTS: 0 %
Band Neutrophils: 0 %
Basophils Relative: 0 %
Eosinophils Absolute: 0.3 10*3/uL (ref 0.0–0.7)
Eosinophils Relative: 7 %
HEMATOCRIT: 40 % (ref 36.0–46.0)
Hemoglobin: 13.5 g/dL (ref 12.0–15.0)
Lymphocytes Relative: 19 %
Lymphs Abs: 0.7 10*3/uL (ref 0.7–4.0)
MCH: 30.8 pg (ref 26.0–34.0)
MCHC: 33.8 g/dL (ref 30.0–36.0)
MCV: 91.1 fL (ref 78.0–100.0)
METAMYELOCYTES PCT: 0 %
MYELOCYTES: 0 %
Monocytes Absolute: 0.1 10*3/uL (ref 0.1–1.0)
Monocytes Relative: 4 %
Neutro Abs: 2.5 10*3/uL (ref 1.7–7.7)
Neutrophils Relative %: 70 %
Other: 0 %
PROMYELOCYTES ABS: 0 %
Platelets: 214 10*3/uL (ref 150–400)
RBC: 4.39 MIL/uL (ref 3.87–5.11)
RDW: 12.7 % (ref 11.5–15.5)
WBC: 3.6 10*3/uL — AB (ref 4.0–10.5)
nRBC: 0 /100 WBC

## 2015-08-13 LAB — URINALYSIS, ROUTINE W REFLEX MICROSCOPIC
Bilirubin Urine: NEGATIVE
Glucose, UA: NEGATIVE mg/dL
KETONES UR: NEGATIVE mg/dL
Leukocytes, UA: NEGATIVE
NITRITE: NEGATIVE
PROTEIN: NEGATIVE mg/dL
Specific Gravity, Urine: 1.008 (ref 1.005–1.030)
pH: 7.5 (ref 5.0–8.0)

## 2015-08-13 LAB — URINE MICROSCOPIC-ADD ON

## 2015-08-13 LAB — PREGNANCY, URINE: PREG TEST UR: NEGATIVE

## 2015-08-13 MED ORDER — SODIUM CHLORIDE 0.9 % IV BOLUS (SEPSIS)
1000.0000 mL | Freq: Once | INTRAVENOUS | Status: AC
  Administered 2015-08-13: 1000 mL via INTRAVENOUS

## 2015-08-13 MED ORDER — ONDANSETRON HCL 4 MG/2ML IJ SOLN
4.0000 mg | Freq: Once | INTRAMUSCULAR | Status: AC
  Administered 2015-08-13: 4 mg via INTRAVENOUS
  Filled 2015-08-13: qty 2

## 2015-08-13 MED ORDER — ONDANSETRON HCL 4 MG PO TABS: 4.0000 mg | ORAL_TABLET | Freq: Four times a day (QID) | ORAL | Status: DC

## 2015-08-13 NOTE — ED Notes (Signed)
C/o n/v/d x 6 days-NAD-steady gait

## 2015-08-13 NOTE — ED Provider Notes (Signed)
CSN: [id number]     Arrival date & time 08/13/15  1129 History   First MD Initiated Contact with Patient 08/13/15 1240     Chief Complaint  Patient presents with  . Diarrhea     (Consider location/radiation/quality/duration/timing/severity/associated sxs/prior Treatment) Patient is a 30 y.o. female presenting with diarrhea and general illness. The history is provided by the patient.  Diarrhea Associated symptoms: vomiting   Associated symptoms: no arthralgias, no chills, no fever, no headaches and no myalgias   Illness Severity:  Moderate Onset quality:  Sudden Duration:  1 week Timing:  Constant Progression:  Unchanged Chronicity:  New Associated symptoms: diarrhea, nausea and vomiting   Associated symptoms: no chest pain, no congestion, no fever, no headaches, no myalgias, no rhinorrhea, no shortness of breath and no wheezing     30 yo F with a chief complaints of nausea vomiting diarrhea. This been going on for the past week. Patient states that she is not sure what exactly is going on. Entire family for the same illness for the vomiting and diarrhea. There is only lasted for 48 hours. Patient is concerned with her persistent symptoms. Feels like she can't keep anything down. Denies fevers or chills. Denies abdominal pain. Denies chance of being pregnant.  Past Medical History  Diagnosis Date  . Eczema   . Hypertension    History reviewed. No pertinent past surgical history. No family history on file. Social History  Substance Use Topics  . Smoking status: Former Smoker -- 0.00 packs/day  . Smokeless tobacco: None  . Alcohol Use: No   OB History    Gravida Para Term Preterm AB TAB SAB Ectopic Multiple Living   Review of Systems  Constitutional: Negative for fever and chills.  HENT: Negative for congestion and rhinorrhea.   Eyes: Negative for redness and visual disturbance.  Respiratory: Negative for shortness of breath and wheezing.    Cardiovascular: Negative for chest pain and palpitations.  Gastrointestinal: Positive for nausea, vomiting and diarrhea.  Genitourinary: Negative for dysuria and urgency.  Musculoskeletal: Negative for myalgias and arthralgias.  Skin: Negative for pallor and wound.  Neurological: Negative for dizziness and headaches.      Allergies  Review of patient's allergies indicates no known allergies.  Home Medications   Prior to Admission medications   Medication Sig Start Date End Date Taking? Authorizing Provider  ondansetron (ZOFRAN) 4 MG tablet Take 1 tablet (4 mg total) by mouth every 6 (six) hours. 08/13/15   Melene Plan, DO   BP 155/103 mmHg  Pulse 110  Temp(Src) 98.4 F (36.9 C) (Oral)  Resp 18  Ht  (1.6 m)  Wt 130 lb (58.968 kg)  BMI 23.03 kg/m2  SpO2 100%  LMP  (LMP Unknown) Physical Exam  Constitutional: She is oriented to person, place, and time. She appears well-developed and well-nourished. No distress.  HENT:  Head: Normocephalic and atraumatic.  Eyes: EOM are normal. Pupils are equal, round, and reactive to light.  Neck: Normal range of motion. Neck supple.  Cardiovascular: Regular rhythm.  Tachycardia present.  Exam reveals no gallop and no friction rub.   No murmur heard. Pulmonary/Chest: Effort normal. She has no wheezes. She has no rales.  Abdominal: Soft. She exhibits no distension. There is no tenderness. There is no rebound and no guarding.  Musculoskeletal: She exhibits no edema or tenderness.  Neurological: She is alert and oriented to person, place,  and time.  Skin: Skin is warm and dry. She is not diaphoretic.  Psychiatric: She has a normal mood and affect. Her behavior is normal.  Nursing note and vitals reviewed.   ED Course  Procedures (including critical care time) Labs Review Labs Reviewed  URINALYSIS, ROUTINE W REFLEX MICROSCOPIC (NOT AT [facility]) - Abnormal; Notable for the following:    Hgb urine dipstick MODERATE (*)    All other  components within normal limits  CBC WITH DIFFERENTIAL/PLATELET - Abnormal; Notable for the following:    WBC 3.6 (*)    All other components within normal limits  COMPREHENSIVE METABOLIC PANEL - Abnormal; Notable for the following:    Alkaline Phosphatase 30 (*)    All other components within normal limits  URINE MICROSCOPIC-ADD ON - Abnormal; Notable for the following:    Squamous Epithelial / LPF 0-5 (*)    Bacteria, UA RARE (*)    All other components within normal limits  PREGNANCY, URINE    Imaging Review No results found. I have personally reviewed and evaluated these images and lab results as part of my medical decision-making.   EKG Interpretation None      MDM   Final diagnoses:  Vomiting and diarrhea    30 yo F with a chief complaint of nausea vomiting and diarrhea. Likely a viral source since this is been spread throughout the family. Patient is well-appearing and nontoxic. Tachycardic on exam.  Due to length of symptoms will recheck labs give a fluid bolus.  Patient feeling mildly better after fluid bolus. Labs unremarkable.  Continues to have benign abdominal exam. No continued vomiting on the ED. No noted tachycardia on my exam. Able totolerate by mouth. We'll discharge the patient home.  3:21 PM:  I have discussed the diagnosis/risks/treatment options with the patient and believe the pt to be eligible for discharge home to follow-up with PCP. We also discussed returning to the ED immediately if new or worsening sx occur. We discussed the sx which are most concerning (e.g., sudden worsening pain, fever, inability to tolerate by mouth) that necessitate immediate return. Medications administered to the patient during their visit and any new prescriptions provided to the patient are listed below.  Medications given during this visit Medications  sodium chloride 0.9 % bolus 1,000 mL (0 mLs Intravenous Stopped 08/13/15 1429)  ondansetron (ZOFRAN) injection 4 mg (4 mg  Intravenous Given 08/13/15 1315)    Discharge Medication List as of 08/13/2015  2:16 PM    START taking these medications   Details  ondansetron (ZOFRAN) 4 MG tablet Take 1 tablet (4 mg total) by mouth every 6 (six) hours., Starting 08/13/2015, Until Discontinued, Print        The patient appears reasonably screen and/or stabilized for discharge and I doubt any other medical condition or other [facility] requiring further screening, evaluation, or treatment in the ED at this time prior to discharge.    Melene Plan, DO 08/13/15 [phone]

## 2015-08-13 NOTE — Discharge Instructions (Signed)

## 2015-08-13 NOTE — ED Notes (Signed)
patient tolerating ginger ale well

## 2015-08-13 NOTE — ED Notes (Signed)
Pt with elevated BP-states she has not been started on HTN meds yet-MD watching BP-was advised to make appt this month but has not

## 2015-08-21 DIAGNOSIS — I10 Essential (primary) hypertension: Secondary | ICD-10-CM | POA: Insufficient documentation

## 2016-07-31 ENCOUNTER — Emergency Department (HOSPITAL_BASED_OUTPATIENT_CLINIC_OR_DEPARTMENT_OTHER): Payer: Managed Care, Other (non HMO)

## 2016-07-31 ENCOUNTER — Emergency Department (HOSPITAL_BASED_OUTPATIENT_CLINIC_OR_DEPARTMENT_OTHER)
Admission: EM | Admit: 2016-07-31 | Discharge: 2016-07-31 | Disposition: A | Discharge status: Home / Self Care | Payer: Managed Care, Other (non HMO) | Attending: Emergency Medicine | Admitting: Emergency Medicine

## 2016-07-31 ENCOUNTER — Encounter (HOSPITAL_BASED_OUTPATIENT_CLINIC_OR_DEPARTMENT_OTHER): Payer: Self-pay | Admitting: Emergency Medicine

## 2016-07-31 DIAGNOSIS — R509 Fever, unspecified: Secondary | ICD-10-CM | POA: Diagnosis present

## 2016-07-31 DIAGNOSIS — J209 Acute bronchitis, unspecified: Secondary | ICD-10-CM | POA: Diagnosis not present

## 2016-07-31 DIAGNOSIS — I10 Essential (primary) hypertension: Secondary | ICD-10-CM | POA: Diagnosis not present

## 2016-07-31 DIAGNOSIS — Z87891 Personal history of nicotine dependence: Secondary | ICD-10-CM | POA: Insufficient documentation

## 2016-07-31 DIAGNOSIS — Z79899 Other long term (current) drug therapy: Secondary | ICD-10-CM | POA: Insufficient documentation

## 2016-07-31 LAB — BASIC METABOLIC PANEL
ANION GAP: 8 (ref 5–15)
BUN: 10 mg/dL (ref 6–20)
CHLORIDE: 103 mmol/L (ref 101–111)
CO2: 22 mmol/L (ref 22–32)
CREATININE: 0.83 mg/dL (ref 0.44–1.00)
Calcium: 8.2 mg/dL — ABNORMAL LOW (ref 8.9–10.3)
GFR calc non Af Amer: >60 mL/min (ref >60)
Glucose, Bld: 98 mg/dL (ref 65–99)
POTASSIUM: 3.1 mmol/L — AB (ref 3.5–5.1)
SODIUM: 133 mmol/L — AB (ref 135–145)

## 2016-07-31 LAB — CBC WITH DIFFERENTIAL/PLATELET
Basophils Absolute: 0 10*3/uL (ref 0.0–0.1)
Basophils Relative: 0 %
EOS ABS: 0 10*3/uL (ref 0.0–0.7)
Eosinophils Relative: 0 %
HEMATOCRIT: 35.8 % — AB (ref 36.0–46.0)
HEMOGLOBIN: 12.1 g/dL (ref 12.0–15.0)
LYMPHS ABS: 0.4 10*3/uL — AB (ref 0.7–4.0)
LYMPHS PCT: 15 %
MCH: 30.7 pg (ref 26.0–34.0)
MCHC: 33.8 g/dL (ref 30.0–36.0)
MCV: 90.9 fL (ref 78.0–100.0)
Monocytes Absolute: 0.5 10*3/uL (ref 0.1–1.0)
Monocytes Relative: 17 %
NEUTROS ABS: 2 10*3/uL (ref 1.7–7.7)
NEUTROS PCT: 68 %
Platelets: 165 10*3/uL (ref 150–400)
RBC: 3.94 MIL/uL (ref 3.87–5.11)
RDW: 12.5 % (ref 11.5–15.5)
WBC: 2.9 10*3/uL — AB (ref 4.0–10.5)

## 2016-07-31 LAB — URINALYSIS, ROUTINE W REFLEX MICROSCOPIC
Bilirubin Urine: NEGATIVE
Glucose, UA: NEGATIVE mg/dL
Ketones, ur: 15 mg/dL — AB
Leukocytes, UA: NEGATIVE
Nitrite: NEGATIVE
Protein, ur: 100 mg/dL — AB
SPECIFIC GRAVITY, URINE: 1.036 — AB (ref 1.005–1.030)
pH: 6.5 (ref 5.0–8.0)

## 2016-07-31 LAB — URINALYSIS, MICROSCOPIC (REFLEX)

## 2016-07-31 LAB — PREGNANCY, URINE: Preg Test, Ur: NEGATIVE

## 2016-07-31 MED ORDER — ACETAMINOPHEN 500 MG PO TABS
1000.0000 mg | ORAL_TABLET | Freq: Once | ORAL | Status: AC
  Administered 2016-07-31: 1000 mg via ORAL
  Filled 2016-07-31: qty 2

## 2016-07-31 MED ORDER — SODIUM CHLORIDE 0.9 % IV BOLUS (SEPSIS)
1000.0000 mL | Freq: Once | INTRAVENOUS | Status: AC
  Administered 2016-07-31: 1000 mL via INTRAVENOUS

## 2016-07-31 MED ORDER — KETOROLAC TROMETHAMINE 30 MG/ML IJ SOLN
30.0000 mg | Freq: Once | INTRAMUSCULAR | Status: AC
  Administered 2016-07-31: 30 mg via INTRAVENOUS
  Filled 2016-07-31: qty 1

## 2016-07-31 MED ORDER — AMOXICILLIN 500 MG PO CAPS
500.0000 mg | ORAL_CAPSULE | Freq: Once | ORAL | Status: AC
  Administered 2016-07-31: 500 mg via ORAL
  Filled 2016-07-31: qty 1

## 2016-07-31 MED ORDER — AMOXICILLIN 500 MG PO CAPS: 500.0000 mg | ORAL_CAPSULE | Freq: Three times a day (TID) | ORAL | 0 refills | Status: DC

## 2016-07-31 MED ORDER — IBUPROFEN 600 MG PO TABS: 600.0000 mg | ORAL_TABLET | Freq: Four times a day (QID) | ORAL | 0 refills | Status: DC | PRN

## 2016-07-31 NOTE — ED Provider Notes (Signed)
[facility] Note   CSN: [id number] Arrival date & time: 07/31/16  1807  By signing my name below, I, Alyssa GroveMartin Green, attest that this documentation has been prepared under the direction and in the presence of Jacalyn LefevreJulie Sandrina Heaton, MD. Electronically Signed: Alyssa GroveMartin Green, ED Scribe. 07/31/16. 7:17 PM.   History   Chief Complaint Chief Complaint  Patient presents with  . Cough   The history is provided by the patient. No language interpreter was used.    HPI Comments: Gabriella Monroe is a 31 y.o. female with PMHx of HTN who presents to the Emergency Department complaining of gradual onset, constant, moderate generalized body aches for 1 week and progressively worsening over the last 2 days. She was seen by her PCP yesterday, but was not prescribed any medication. Pt reports productive cough, fever, rhinorrhea, diarrhea, chest pain. She has taken Mucinex PM with no significant relief to symptoms. Pt is a former smoker. She denies leg pain  Past Medical History:  Diagnosis Date  . Eczema   . Hypertension     There are no active problems to display for this patient.   History reviewed. No pertinent surgical history.  OB History    Gravida Para Term Preterm AB Living   3 1     1      SAB TAB Ectopic Multiple Live Births                   Home Medications    Prior to Admission medications   Medication Sig Start Date End Date Taking? Authorizing Provider  valsartan (DIOVAN) 80 MG tablet Take 80 mg by mouth daily.   Yes Historical Provider, MD  amoxicillin (AMOXIL) 500 MG capsule Take 1 capsule (500 mg total) by mouth 3 (three) times daily. 07/31/16   Jacalyn LefevreJulie Jaya Lapka, MD  ibuprofen (ADVIL,MOTRIN) 600 MG tablet Take 1 tablet (600 mg total) by mouth every 6 (six) hours as needed. 07/31/16   Jacalyn LefevreJulie Melani Brisbane, MD  ondansetron (ZOFRAN) 4 MG tablet Take 1 tablet (4 mg total) by mouth every 6 (six) hours. 08/13/15   Melene Planan Floyd, DO    Family History No family history on  file.  Social History Social History  Substance Use Topics  . Smoking status: Former Smoker    Packs/day: 0.00  . Smokeless tobacco: Never Used  . Alcohol use No    Allergies   Lisinopril   Review of Systems Review of Systems  Constitutional: Positive for fever.  HENT: Positive for rhinorrhea.   Respiratory: Positive for cough.   Cardiovascular: Positive for chest pain. Negative for leg swelling.  Gastrointestinal: Positive for diarrhea.  All other systems reviewed and are negative.  Physical Exam Updated Vital Signs BP (!) 96/54   Pulse 107   Temp 98.9 F (37.2 C) (Oral)   Resp 24   Ht 5\' 3"  (1.6 m)   Wt 141 lb (64 kg)   SpO2 100%   BMI 24.98 kg/m   Physical Exam  Constitutional: She is oriented to person, place, and time. She appears well-developed and well-nourished.  HENT:  Head: Normocephalic and atraumatic.  Right Ear: External ear normal.  Left Ear: External ear normal.  Nose: Nose normal.  Mouth/Throat: Oropharynx is clear and moist.  Eyes: Conjunctivae and EOM are normal. Pupils are equal, round, and reactive to light.  Neck: Normal range of motion. Neck supple.  Cardiovascular: Regular rhythm, normal heart sounds and intact distal pulses.   Tachycardia  Pulmonary/Chest: Effort normal and breath  sounds normal.  Abdominal: Soft. Bowel sounds are normal.  Musculoskeletal: Normal range of motion.  Neurological: She is alert and oriented to person, place, and time.  Skin: Skin is warm.  Psychiatric: She has a normal mood and affect. Her behavior is normal. Judgment and thought content normal.  Nursing note and vitals reviewed.    ED Treatments / Results  DIAGNOSTIC STUDIES: Oxygen Saturation is 99% on RA, normal by my interpretation.    COORDINATION OF CARE: 7:16 PM Discussed treatment plan with pt at bedside which includes Toradol, BMP, CBC with differential and Urinalysis and pt agreed to plan.  Labs (all labs ordered are listed, but only  abnormal results are displayed) Labs Reviewed  BASIC METABOLIC PANEL - Abnormal; Notable for the following:       Result Value   Sodium 133 (*)    Potassium 3.1 (*)    Calcium 8.2 (*)    All other components within normal limits  CBC WITH DIFFERENTIAL/PLATELET - Abnormal; Notable for the following:    WBC 2.9 (*)    HCT 35.8 (*)    Lymphs Abs 0.4 (*)    All other components within normal limits  URINALYSIS, ROUTINE W REFLEX MICROSCOPIC - Abnormal; Notable for the following:    Color, Urine AMBER (*)    APPearance CLOUDY (*)    Specific Gravity, Urine 1.036 (*)    Hgb urine dipstick LARGE (*)    Ketones, ur 15 (*)    Protein, ur 100 (*)    All other components within normal limits  URINALYSIS, MICROSCOPIC (REFLEX) - Abnormal; Notable for the following:    Bacteria, UA FEW (*)    Squamous Epithelial / LPF 0-5 (*)    All other components within normal limits  PREGNANCY, URINE   Pt is on period. EKG  EKG Interpretation None       Radiology Dg Chest 2 View  Result Date: 07/31/2016 CLINICAL DATA:  Acute onset of cough and generalized chest pain. Body aches. Initial encounter. EXAM: CHEST  2 VIEW COMPARISON:  Chest radiograph performed 11/28/2014 FINDINGS: The lungs are well-aerated and clear. There is no evidence of focal opacification, pleural effusion or pneumothorax. The heart is normal in size; the mediastinal contour is within normal limits. No acute osseous abnormalities are seen. IMPRESSION: No acute cardiopulmonary process seen. Electronically Signed   By: Roanna Raider M.D.   On: 07/31/2016 18:57    Procedures Procedures (including critical care time)  Medications Ordered in ED Medications  amoxicillin (AMOXIL) capsule 500 mg (not administered)  acetaminophen (TYLENOL) tablet 1,000 mg (1,000 mg Oral Given 07/31/16 1830)  sodium chloride 0.9 % bolus 1,000 mL (1,000 mLs Intravenous New Bag/Given 07/31/16 1933)  ketorolac (TORADOL) 30 MG/ML injection 30 mg (30 mg  Intravenous Given 07/31/16 1939)     Initial Impression / Assessment and Plan / ED Course  I have reviewed the triage vital signs and the nursing notes.  Pertinent labs & imaging results that were available during my care of the patient were reviewed by me and considered in my medical decision making (see chart for details).  Clinical Course    Pt feels much better after fluids, tylenol, and ibuprofen.  Sx are likely viral, but they have been going on for over a week.  Due to that, I will treat her with amox.  She knows to return if worse.  Final Clinical Impressions(s) / ED Diagnoses   Final diagnoses:  Fever, unspecified fever cause  Acute bronchitis,  unspecified organism    New Prescriptions New Prescriptions   AMOXICILLIN (AMOXIL) 500 MG CAPSULE    Take 1 capsule (500 mg total) by mouth 3 (three) times daily.   IBUPROFEN (ADVIL,MOTRIN) 600 MG TABLET    Take 1 tablet (600 mg total) by mouth every 6 (six) hours as needed.   I personally performed the services described in this documentation, which was scribed in my presence. The recorded information has been reviewed and is accurate.    Jacalyn Lefevre, MD 07/31/16 2030

## 2016-07-31 NOTE — ED Notes (Signed)
Given ginger ale 

## 2016-07-31 NOTE — ED Triage Notes (Signed)
Pt c/o cough and body aches x 2 days. Seen by PCP yesterday and had flu test, no rx given. Pt states symptoms seem worse today.

## 2019-05-03 ENCOUNTER — Other Ambulatory Visit: Payer: Self-pay

## 2019-05-03 ENCOUNTER — Emergency Department (HOSPITAL_BASED_OUTPATIENT_CLINIC_OR_DEPARTMENT_OTHER)
Admission: EM | Admit: 2019-05-03 | Discharge: 2019-05-03 | Disposition: A | Discharge status: Home / Self Care | Payer: Self-pay | Attending: Emergency Medicine | Admitting: Emergency Medicine

## 2019-05-03 ENCOUNTER — Emergency Department (HOSPITAL_BASED_OUTPATIENT_CLINIC_OR_DEPARTMENT_OTHER): Payer: Self-pay

## 2019-05-03 ENCOUNTER — Encounter (HOSPITAL_BASED_OUTPATIENT_CLINIC_OR_DEPARTMENT_OTHER): Payer: Self-pay

## 2019-05-03 DIAGNOSIS — Y999 Unspecified external cause status: Secondary | ICD-10-CM | POA: Insufficient documentation

## 2019-05-03 DIAGNOSIS — S4381XA Sprain of other specified parts of right shoulder girdle, initial encounter: Secondary | ICD-10-CM | POA: Insufficient documentation

## 2019-05-03 DIAGNOSIS — Y9289 Other specified places as the place of occurrence of the external cause: Secondary | ICD-10-CM | POA: Insufficient documentation

## 2019-05-03 DIAGNOSIS — W108XXA Fall (on) (from) other stairs and steps, initial encounter: Secondary | ICD-10-CM | POA: Insufficient documentation

## 2019-05-03 DIAGNOSIS — S43401A Unspecified sprain of right shoulder joint, initial encounter: Secondary | ICD-10-CM

## 2019-05-03 DIAGNOSIS — I10 Essential (primary) hypertension: Secondary | ICD-10-CM | POA: Insufficient documentation

## 2019-05-03 DIAGNOSIS — F17210 Nicotine dependence, cigarettes, uncomplicated: Secondary | ICD-10-CM | POA: Insufficient documentation

## 2019-05-03 DIAGNOSIS — Y9301 Activity, walking, marching and hiking: Secondary | ICD-10-CM | POA: Insufficient documentation

## 2019-05-03 NOTE — ED Triage Notes (Signed)
Pt states she fell on steps last night-pain to right shoulder area-NAD-steady gait

## 2019-05-03 NOTE — ED Provider Notes (Signed)
[facility] EMERGENCY DEPARTMENT Provider Note   CSN: [id number] Arrival date & time: 05/03/19  1151     History   Chief Complaint Chief Complaint  Patient presents with  . Fall    HPI Gabriella Monroe is a 33 y.o. female.     Patient is a 33 year old healthy female with a history of hypertension presenting today with persistent right shoulder pain.  Patient states that last night she was going down the stairs when she slipped and fell down multiple stairs.  She denies hitting her head or loss of consciousness.  She was able to walk after this but she has had severe right shoulder pain.  She was looked reaching up behind her trying to grab onto the rail with the right arm and thinks that is why it hurts.  The pain is worse with movement.  She denies any chest pain or shortness of breath.  No abdominal pain.  Patient does not take anticoagulation.  The history is provided by the patient.  Fall This is a new problem. The current episode started yesterday. The problem occurs constantly. The problem has not changed since onset.Associated symptoms comments: Right shoulder pain. The symptoms are aggravated by bending and twisting. The symptoms are relieved by rest. Treatments tried: bc powder and muscle rub. The treatment provided mild relief.    Past Medical History:  Diagnosis Date  . Eczema   . Hypertension     There are no active problems to display for this patient.   History reviewed. No pertinent surgical history.   OB History    Gravida  3   Para  1   Term      Preterm      AB  1   Living        SAB      TAB      Ectopic      Multiple      Live Births               Home Medications    Prior to Admission medications   Medication Sig Start Date End Date Taking? Authorizing Provider  norethindrone-ethinyl estradiol (JUNEL 1/20) 1-20 MG-MCG tablet TAKE 1 TABLET BY MOUTH EVERY DAY 03/23/19  Yes [provider]  amoxicillin (AMOXIL) 500  MG capsule Take 1 capsule (500 mg total) by mouth 3 (three) times daily. 07/31/16   Isla Pence, MD  ibuprofen (ADVIL,MOTRIN) 600 MG tablet Take 1 tablet (600 mg total) by mouth every 6 (six) hours as needed. 07/31/16   Isla Pence, MD  ondansetron (ZOFRAN) 4 MG tablet Take 1 tablet (4 mg total) by mouth every 6 (six) hours. 08/13/15   Deno Etienne, DO  valsartan (DIOVAN) 80 MG tablet Take 80 mg by mouth daily.    [provider]    Family History No family history on file.  Social History Social History   Tobacco Use  . Smoking status: Current Every Day Smoker    Packs/day: 0.00    Types: Cigars  . Smokeless tobacco: Never Used  Substance Use Topics  . Alcohol use: Yes    Comment: occ  . Drug use: No     Allergies   Lisinopril   Review of Systems Review of Systems  All other systems reviewed and are negative.    Physical Exam Updated Vital Signs BP (!) 157/104 (BP Location: Left Arm)   Pulse 90   Temp 98.8 F (37.1 C) (Oral)   Resp 18  Ht 5\' 3"  (1.6 m)   Wt 60.3 kg   LMP 04/20/2019   SpO2 100%   BMI 23.56 kg/m   Physical Exam Vitals signs and nursing note reviewed.  Constitutional:      General: She is not in acute distress.    Appearance: Normal appearance. She is well-developed and normal weight.  HENT:     Head: Normocephalic and atraumatic.  Eyes:     Pupils: Pupils are equal, round, and reactive to light.  Cardiovascular:     Rate and Rhythm: Normal rate.  Pulmonary:     Effort: Pulmonary effort is normal.  Chest:     Chest wall: No tenderness.  Abdominal:     General: Bowel sounds are normal. There is no distension.     Palpations: Abdomen is soft.     Tenderness: There is no abdominal tenderness. There is no guarding or rebound.  Musculoskeletal: Normal range of motion.        General: Tenderness present.     Right shoulder: She exhibits tenderness, bony tenderness, pain and spasm. She exhibits normal range of motion, no  deformity, normal pulse and normal strength.       Arms:     Comments: No edema  Skin:    General: Skin is warm and dry.     Findings: No rash.  Neurological:     General: No focal deficit present.     Mental Status: She is alert and oriented to person, place, and time. Mental status is at baseline.     Cranial Nerves: No cranial nerve deficit.  Psychiatric:        Mood and Affect: Mood normal.        Behavior: Behavior normal.        Thought Content: Thought content normal.      ED Treatments / Results  Labs (all labs ordered are listed, but only abnormal results are displayed) Labs Reviewed - No data to display  EKG None  Radiology Dg Shoulder Right  Result Date: 05/03/2019 CLINICAL DATA:  Fall. EXAM: RIGHT SHOULDER - 2+ VIEW COMPARISON:  Chest x-ray 07/31/2016. FINDINGS: Acromioclavicular and glenohumeral degenerative change. No acute bony or joint abnormality identified. No evidence of fracture or dislocation. No evidence of separation. IMPRESSION: No acute abnormality. Electronically Signed   By: 09/28/2016  Register   On: 05/03/2019 12:42    Procedures Procedures (including critical care time)  Medications Ordered in ED Medications - No data to display   Initial Impression / Assessment and Plan / ED Course  I have reviewed the triage vital signs and the nursing notes.  Pertinent labs & imaging results that were available during my care of the patient were reviewed by me and considered in my medical decision making (see chart for details).        Patient presenting after an injury to her right shoulder last night while falling down the stairs.  X-rays negative for acute fracture but concern for sprain.  Encouraged patient to range the shoulder regularly and use NSAIDs and muscle creams as needed.  She was given follow-up with sports medicine if symptoms do not improve.  Final Clinical Impressions(s) / ED Diagnoses   Final diagnoses:  Sprain of right shoulder,  unspecified shoulder sprain type, initial encounter    ED Discharge Orders    None       05/05/2019, MD 05/03/19 1345

## 2019-05-03 NOTE — ED Notes (Signed)
Pt refuses ice at this time

## 2019-05-03 NOTE — ED Notes (Signed)
Pt did not want to have an ice bag on injury

## 2019-08-28 ENCOUNTER — Emergency Department (HOSPITAL_BASED_OUTPATIENT_CLINIC_OR_DEPARTMENT_OTHER): Payer: No Typology Code available for payment source

## 2019-08-28 ENCOUNTER — Other Ambulatory Visit: Payer: Self-pay

## 2019-08-28 ENCOUNTER — Emergency Department (HOSPITAL_BASED_OUTPATIENT_CLINIC_OR_DEPARTMENT_OTHER)
Admission: EM | Admit: 2019-08-28 | Discharge: 2019-08-28 | Disposition: A | Discharge status: Home / Self Care | Payer: No Typology Code available for payment source | Attending: Emergency Medicine | Admitting: Emergency Medicine

## 2019-08-28 DIAGNOSIS — Z79899 Other long term (current) drug therapy: Secondary | ICD-10-CM | POA: Insufficient documentation

## 2019-08-28 DIAGNOSIS — G43909 Migraine, unspecified, not intractable, without status migrainosus: Secondary | ICD-10-CM | POA: Diagnosis not present

## 2019-08-28 DIAGNOSIS — Z888 Allergy status to other drugs, medicaments and biological substances status: Secondary | ICD-10-CM | POA: Diagnosis not present

## 2019-08-28 DIAGNOSIS — R519 Headache, unspecified: Secondary | ICD-10-CM | POA: Diagnosis present

## 2019-08-28 DIAGNOSIS — I10 Essential (primary) hypertension: Secondary | ICD-10-CM

## 2019-08-28 DIAGNOSIS — F17210 Nicotine dependence, cigarettes, uncomplicated: Secondary | ICD-10-CM | POA: Diagnosis not present

## 2019-08-28 DIAGNOSIS — Z20822 Contact with and (suspected) exposure to covid-19: Secondary | ICD-10-CM | POA: Insufficient documentation

## 2019-08-28 LAB — BASIC METABOLIC PANEL
Anion gap: 7 (ref 5–15)
BUN: 11 mg/dL (ref 6–20)
CO2: 25 mmol/L (ref 22–32)
Calcium: 8.3 mg/dL — ABNORMAL LOW (ref 8.9–10.3)
Chloride: 104 mmol/L (ref 98–111)
Creatinine, Ser: 0.87 mg/dL (ref 0.44–1.00)
GFR calc Af Amer: >60 mL/min (ref >60)
GFR calc non Af Amer: >60 mL/min (ref >60)
Glucose, Bld: 88 mg/dL (ref 70–99)
Potassium: 3.7 mmol/L (ref 3.5–5.1)
Sodium: 136 mmol/L (ref 135–145)

## 2019-08-28 LAB — CBC WITH DIFFERENTIAL/PLATELET
Abs Immature Granulocytes: 0.01 10*3/uL (ref 0.00–0.07)
Basophils Absolute: 0.1 10*3/uL (ref 0.0–0.1)
Basophils Relative: 2 %
Eosinophils Absolute: 0.2 10*3/uL (ref 0.0–0.5)
Eosinophils Relative: 4 %
HCT: 44.8 % (ref 36.0–46.0)
Hemoglobin: 14.9 g/dL (ref 12.0–15.0)
Immature Granulocytes: 0 %
Lymphocytes Relative: 37 %
Lymphs Abs: 1.7 10*3/uL (ref 0.7–4.0)
MCH: 31.2 pg (ref 26.0–34.0)
MCHC: 33.3 g/dL (ref 30.0–36.0)
MCV: 93.7 fL (ref 80.0–100.0)
Monocytes Absolute: 0.4 10*3/uL (ref 0.1–1.0)
Monocytes Relative: 8 %
Neutro Abs: 2.3 10*3/uL (ref 1.7–7.7)
Neutrophils Relative %: 49 %
Platelets: 287 10*3/uL (ref 150–400)
RBC: 4.78 MIL/uL (ref 3.87–5.11)
RDW: 13.1 % (ref 11.5–15.5)
WBC: 4.6 10*3/uL (ref 4.0–10.5)
nRBC: 0 % (ref 0.0–0.2)

## 2019-08-28 LAB — PREGNANCY, URINE: Preg Test, Ur: NEGATIVE

## 2019-08-28 LAB — SARS CORONAVIRUS 2 (TAT 6-24 HRS): SARS Coronavirus 2: NEGATIVE

## 2019-08-28 MED ORDER — SODIUM CHLORIDE 0.9 % IV BOLUS
1000.0000 mL | Freq: Once | INTRAVENOUS | Status: AC
  Administered 2019-08-28: 11:00:00 1000 mL via INTRAVENOUS

## 2019-08-28 MED ORDER — ONDANSETRON HCL 4 MG/2ML IJ SOLN
4.0000 mg | Freq: Once | INTRAMUSCULAR | Status: AC
  Administered 2019-08-28: 11:00:00 4 mg via INTRAVENOUS
  Filled 2019-08-28: qty 2

## 2019-08-28 MED ORDER — SODIUM CHLORIDE 0.9 % IV SOLN: INTRAVENOUS | Status: DC

## 2019-08-28 MED ORDER — KETOROLAC TROMETHAMINE 15 MG/ML IJ SOLN
15.0000 mg | Freq: Once | INTRAMUSCULAR | Status: AC
  Administered 2019-08-28: 15 mg via INTRAVENOUS
  Filled 2019-08-28: qty 1

## 2019-08-28 MED ORDER — METOPROLOL TARTRATE 5 MG/5ML IV SOLN
5.0000 mg | Freq: Once | INTRAVENOUS | Status: AC
  Administered 2019-08-28: 11:00:00 5 mg via INTRAVENOUS
  Filled 2019-08-28: qty 5

## 2019-08-28 MED ORDER — LABETALOL HCL 5 MG/ML IV SOLN
20.0000 mg | Freq: Once | INTRAVENOUS | Status: AC
  Administered 2019-08-28: 12:00:00 20 mg via INTRAVENOUS
  Filled 2019-08-28: qty 4

## 2019-08-28 NOTE — Discharge Instructions (Addendum)
Take your BP medication every day.

## 2019-08-28 NOTE — ED Provider Notes (Signed)
[facility] EMERGENCY DEPARTMENT Provider Note   CSN: [id number] Arrival date & time: 08/28/19  1005     History Chief Complaint  Patient presents with  . Headache    Gabriella Monroe is a 34 y.o. female.  Pt presents to the ED today with a headache behind her right eye.  It has been going on for 4 days.  She has a hx of HTN and is hypertensive today.  She did not take her meds today.  Pt does not have a hx of migraines.  She does work at [employer] and 1 of the screening questions is headache.  Due to that, she came in to be evaluated.  She denies any known covid exposures.  No f/c.  No sob.  No cough.  No loss of taste/smell.        Past Medical History:  Diagnosis Date  . Eczema   . Hypertension     There are no problems to display for this patient.   No past surgical history on file.   OB History    Gravida  3   Para  1   Term      Preterm      AB  1   Living        SAB      TAB      Ectopic      Multiple      Live Births              No family history on file.  Social History   Tobacco Use  . Smoking status: Current Every Day Smoker    Packs/day: 0.00    Types: Cigars  . Smokeless tobacco: Never Used  Substance Use Topics  . Alcohol use: Yes    Comment: occ  . Drug use: No    Home Medications Prior to Admission medications   Medication Sig Start Date End Date Taking? Authorizing Provider  norethindrone-ethinyl estradiol (JUNEL 1/20) 1-20 MG-MCG tablet TAKE 1 TABLET BY MOUTH EVERY DAY 03/23/19  Yes [provider]  valsartan (DIOVAN) 80 MG tablet Take 80 mg by mouth daily.   Yes [provider]  amoxicillin (AMOXIL) 500 MG capsule Take 1 capsule (500 mg total) by mouth 3 (three) times daily. 07/31/16   Jacalyn Lefevre, MD  ibuprofen (ADVIL,MOTRIN) 600 MG tablet Take 1 tablet (600 mg total) by mouth every 6 (six) hours as needed. 07/31/16   Jacalyn Lefevre, MD  ondansetron (ZOFRAN) 4 MG tablet Take 1 tablet (4 mg  total) by mouth every 6 (six) hours. 08/13/15   Melene Plan, DO    Allergies    Lisinopril  Review of Systems   Review of Systems  Neurological: Positive for headaches.  All other systems reviewed and are negative.   Physical Exam Updated Vital Signs BP 131/77   Pulse 77   Temp 98.7 F (37.1 C) (Oral)   Resp 16   SpO2 100%   Physical Exam Vitals and nursing note reviewed.  Constitutional:      Appearance: She is well-developed.  HENT:     Head: Normocephalic and atraumatic.     Mouth/Throat:     Mouth: Mucous membranes are moist.  Eyes:     Extraocular Movements: Extraocular movements intact.     Pupils: Pupils are equal, round, and reactive to light.  Cardiovascular:     Rate and Rhythm: Normal rate and regular rhythm.  Pulmonary:     Effort: Pulmonary effort  is normal.     Breath sounds: Normal breath sounds.  Abdominal:     General: Bowel sounds are normal.     Palpations: Abdomen is soft.  Musculoskeletal:        General: Normal range of motion.     Cervical back: Normal range of motion and neck supple.  Skin:    General: Skin is warm.     Capillary Refill: Capillary refill takes less than 2 seconds.  Neurological:     Mental Status: She is alert and oriented to person, place, and time.  Psychiatric:        Mood and Affect: Mood normal.        Speech: Speech normal.        Behavior: Behavior normal.     ED Results / Procedures / Treatments   Labs (all labs ordered are listed, but only abnormal results are displayed) Labs Reviewed  BASIC METABOLIC PANEL - Abnormal; Notable for the following components:      Result Value   Calcium 8.3 (*)    All other components within normal limits  SARS CORONAVIRUS 2 (TAT 6-24 HRS)  PREGNANCY, URINE  CBC WITH DIFFERENTIAL/PLATELET    EKG None  Radiology CT HEAD WO CONTRAST  Result Date: 08/28/2019 CLINICAL DATA:  34 year old female with constant headache for 3 days. Pain behind right eye. EXAM: CT HEAD  WITHOUT CONTRAST TECHNIQUE: Contiguous axial images were obtained from the base of the skull through the vertex without intravenous contrast. COMPARISON:  None. FINDINGS: Brain: Deep bony sella turcica versus partially empty sella (sagittal image 27. Normal cerebral volume. Mild streak artifact intermittently through the brain today (such as on series 2, image 19). No midline shift, ventriculomegaly, mass effect, evidence of mass lesion, intracranial hemorrhage or evidence of cortically based acute infarction. Gray-white matter differentiation is within normal limits throughout the brain. Vascular: No suspicious intracranial vascular hyperdensity. Skull: Negative. Sinuses/Orbits: Visualized paranasal sinuses and mastoids are clear. Other: Visualized orbits and scalp soft tissues are within normal limits. IMPRESSION: 1. Questionable partially empty sella - which can be a normal variant but also is associated with idiopathic intracranial hypertension (pseudotumor cerebri). 2. Otherwise negative noncontrast CT appearance of the brain, with intermittent streak artifact noted. Electronically Signed   By: Odessa Fleming M.D.   On: 08/28/2019 10:49    Procedures Procedures (including critical care time)  Medications Ordered in ED Medications  sodium chloride 0.9 % bolus 1,000 mL (0 mLs Intravenous Stopped 08/28/19 1208)    And  0.9 %  sodium chloride infusion ( Intravenous New Bag/Given 08/28/19 1208)  ketorolac (TORADOL) 15 MG/ML injection 15 mg (15 mg Intravenous Given 08/28/19 1056)  ondansetron (ZOFRAN) injection 4 mg (4 mg Intravenous Given 08/28/19 1056)  metoprolol tartrate (LOPRESSOR) injection 5 mg (5 mg Intravenous Given 08/28/19 1059)  labetalol (NORMODYNE) injection 20 mg (20 mg Intravenous Given 08/28/19 1144)    ED Course  I have reviewed the triage vital signs and the nursing notes.  Pertinent labs & imaging results that were available during my care of the patient were reviewed by me and considered in my  medical decision making (see chart for details).    MDM Rules/Calculators/A&P                      Pt is feeling much better.  BP is now 131/77.  CT shows a partial empty sella, but it is likely a normal variant.  I doubt idiopathic intracranial hypertension.  I think headache was from elevated bp.  Pt was tested for Covid, but I have a low suspicion.  Pt knows to return if worse. She is told to take her bp meds every day.  F/u with her pcp.  Braileigh Landenberger was evaluated in Emergency Department on 08/28/2019 for the symptoms described in the history of present illness. She was evaluated in the context of the global COVID-19 pandemic, which necessitated consideration that the patient might be at risk for infection with the SARS-CoV-2 virus that causes COVID-19. Institutional protocols and algorithms that pertain to the evaluation of patients at risk for COVID-19 are in a state of rapid change based on information released by regulatory bodies including the CDC and federal and state organizations. These policies and algorithms were followed during the patient's care in the ED.   Final Clinical Impression(s) / ED Diagnoses Final diagnoses:  Essential hypertension  Acute nonintractable headache, unspecified headache type    Rx / DC Orders ED Discharge Orders    None       Isla Pence, MD 08/28/19 1301

## 2019-08-28 NOTE — ED Triage Notes (Signed)
Pt states she has had a constant headache behind her right eye since Thursday. Pt has HTN and takes her BP meds but did not take any today. (BP:179/120). Neuro intact.

## 2019-12-06 ENCOUNTER — Emergency Department (HOSPITAL_BASED_OUTPATIENT_CLINIC_OR_DEPARTMENT_OTHER): Payer: 59

## 2019-12-06 ENCOUNTER — Other Ambulatory Visit: Payer: Self-pay

## 2019-12-06 ENCOUNTER — Emergency Department (HOSPITAL_BASED_OUTPATIENT_CLINIC_OR_DEPARTMENT_OTHER)
Admission: EM | Admit: 2019-12-06 | Discharge: 2019-12-06 | Disposition: A | Discharge status: Home / Self Care | Payer: 59 | Attending: Emergency Medicine | Admitting: Emergency Medicine

## 2019-12-06 ENCOUNTER — Encounter (HOSPITAL_BASED_OUTPATIENT_CLINIC_OR_DEPARTMENT_OTHER): Payer: Self-pay | Admitting: *Deleted

## 2019-12-06 DIAGNOSIS — I10 Essential (primary) hypertension: Secondary | ICD-10-CM | POA: Diagnosis not present

## 2019-12-06 DIAGNOSIS — Z79899 Other long term (current) drug therapy: Secondary | ICD-10-CM | POA: Insufficient documentation

## 2019-12-06 DIAGNOSIS — F17290 Nicotine dependence, other tobacco product, uncomplicated: Secondary | ICD-10-CM | POA: Insufficient documentation

## 2019-12-06 DIAGNOSIS — Z20822 Contact with and (suspected) exposure to covid-19: Secondary | ICD-10-CM | POA: Diagnosis not present

## 2019-12-06 DIAGNOSIS — F17210 Nicotine dependence, cigarettes, uncomplicated: Secondary | ICD-10-CM | POA: Insufficient documentation

## 2019-12-06 DIAGNOSIS — J069 Acute upper respiratory infection, unspecified: Secondary | ICD-10-CM

## 2019-12-06 DIAGNOSIS — R05 Cough: Secondary | ICD-10-CM | POA: Diagnosis present

## 2019-12-06 LAB — CBC WITH DIFFERENTIAL/PLATELET
Abs Immature Granulocytes: 0.01 10*3/uL (ref 0.00–0.07)
Basophils Absolute: 0 10*3/uL (ref 0.0–0.1)
Basophils Relative: 1 %
Eosinophils Absolute: 0.1 10*3/uL (ref 0.0–0.5)
Eosinophils Relative: 3 %
HCT: 42.2 % (ref 36.0–46.0)
Hemoglobin: 14.4 g/dL (ref 12.0–15.0)
Immature Granulocytes: 0 %
Lymphocytes Relative: 35 %
Lymphs Abs: 1.7 10*3/uL (ref 0.7–4.0)
MCH: 32.3 pg (ref 26.0–34.0)
MCHC: 34.1 g/dL (ref 30.0–36.0)
MCV: 94.6 fL (ref 80.0–100.0)
Monocytes Absolute: 0.4 10*3/uL (ref 0.1–1.0)
Monocytes Relative: 9 %
Neutro Abs: 2.5 10*3/uL (ref 1.7–7.7)
Neutrophils Relative %: 52 %
Platelets: 225 10*3/uL (ref 150–400)
RBC: 4.46 MIL/uL (ref 3.87–5.11)
RDW: 13 % (ref 11.5–15.5)
WBC: 4.8 10*3/uL (ref 4.0–10.5)
nRBC: 0 % (ref 0.0–0.2)

## 2019-12-06 LAB — COMPREHENSIVE METABOLIC PANEL
ALT: 18 U/L (ref 0–44)
AST: 20 U/L (ref 15–41)
Albumin: 4.5 g/dL (ref 3.5–5.0)
Alkaline Phosphatase: 39 U/L (ref 38–126)
Anion gap: 10 (ref 5–15)
BUN: 10 mg/dL (ref 6–20)
CO2: 25 mmol/L (ref 22–32)
Calcium: 9.3 mg/dL (ref 8.9–10.3)
Chloride: 102 mmol/L (ref 98–111)
Creatinine, Ser: 0.82 mg/dL (ref 0.44–1.00)
GFR calc Af Amer: >60 mL/min (ref >60)
GFR calc non Af Amer: >60 mL/min (ref >60)
Glucose, Bld: 80 mg/dL (ref 70–99)
Potassium: 3.7 mmol/L (ref 3.5–5.1)
Sodium: 137 mmol/L (ref 135–145)
Total Bilirubin: 0.6 mg/dL (ref 0.3–1.2)
Total Protein: 7.6 g/dL (ref 6.5–8.1)

## 2019-12-06 LAB — LIPASE, BLOOD: Lipase: 26 U/L (ref 11–51)

## 2019-12-06 LAB — SARS CORONAVIRUS 2 BY RT PCR (HOSPITAL ORDER, PERFORMED IN ~~LOC~~ HOSPITAL LAB): SARS Coronavirus 2: NEGATIVE

## 2019-12-06 MED ORDER — SODIUM CHLORIDE 0.9 % IV BOLUS
1000.0000 mL | Freq: Once | INTRAVENOUS | Status: AC
  Administered 2019-12-06: 1000 mL via INTRAVENOUS

## 2019-12-06 MED ORDER — BENZONATATE 100 MG PO CAPS: 100.0000 mg | ORAL_CAPSULE | Freq: Three times a day (TID) | ORAL | 0 refills | Status: DC

## 2019-12-06 MED ORDER — FAMOTIDINE IN NACL 20-0.9 MG/50ML-% IV SOLN
20.0000 mg | Freq: Once | INTRAVENOUS | Status: AC
  Administered 2019-12-06: 20 mg via INTRAVENOUS
  Filled 2019-12-06: qty 50

## 2019-12-06 MED ORDER — KETOROLAC TROMETHAMINE 30 MG/ML IJ SOLN
30.0000 mg | Freq: Once | INTRAMUSCULAR | Status: AC
  Administered 2019-12-06: 30 mg via INTRAVENOUS
  Filled 2019-12-06: qty 1

## 2019-12-06 MED ORDER — GUAIFENESIN-CODEINE 100-10 MG/5ML PO SOLN: 5.0000 mL | Freq: Three times a day (TID) | ORAL | 0 refills | Status: DC | PRN

## 2019-12-06 NOTE — Discharge Instructions (Signed)
Take medications as needed to help with your cough. Make sure you are staying hydrated, drink plenty of fluids and take Tylenol and ibuprofen help with your symptoms. Return to the ER if you start to experience worsening chest pain, shortness of breath, abdominal pain, vomiting or coughing up blood.

## 2019-12-06 NOTE — ED Triage Notes (Signed)
Cough, body aches, headache, fatigue, vomiting, abdominal pain, sore throat and runny nose for a week.

## 2019-12-06 NOTE — ED Provider Notes (Signed)
[facility] EMERGENCY DEPARTMENT Provider Note   CSN: [id number] Arrival date & time: 12/06/19  1715     History Chief Complaint  Patient presents with  . Cough    Gabriella Monroe is a 34 y.o. female with a past medical history of hypertension presenting to the ED with a chief complaint of cough.  States about 1 week ago she ate at [employer], came home and started coughing.  States that she had one episode of posttussive emesis and diarrhea that night.  She states that her GI symptoms improved but she continues to have a cough productive with mucus, pain in her epigastric area and left lower back which she believes is from coughing. Reports epigastric pain "is when my stomach starts to growl, it comes and goes like contractions."  She has tried over-the-counter medications to help with the cough with only minimal improvement.  States that her children began coughing prior to her symptoms but their symptoms improved after 2 days.  No known Covid exposures but she did go to a wedding over the weekend.  She denies any chest pain, shortness of breath, urinary symptoms, changes to bowel movements, prior abdominal surgeries or possibility of pregnancy.  HPI     Past Medical History:  Diagnosis Date  . Eczema   . Hypertension     There are no problems to display for this patient.   History reviewed. No pertinent surgical history.   OB History    Gravida  3   Para  1   Term      Preterm      AB  1   Living        SAB      TAB      Ectopic      Multiple      Live Births              No family history on file.  Social History   Tobacco Use  . Smoking status: Current Every Day Smoker    Packs/day: 0.00    Types: Cigars  . Smokeless tobacco: Never Used  Substance Use Topics  . Alcohol use: Yes    Comment: occ  . Drug use: No    Home Medications Prior to Admission medications   Medication Sig Start Date End Date Taking? Authorizing Provider    valsartan (DIOVAN) 80 MG tablet Take 80 mg by mouth daily.   Yes [provider]  amoxicillin (AMOXIL) 500 MG capsule Take 1 capsule (500 mg total) by mouth 3 (three) times daily. 07/31/16   Isla Pence, MD  benzonatate (TESSALON) 100 MG capsule Take 1 capsule (100 mg total) by mouth every 8 (eight) hours. 12/06/19   Makayli Bracken, PA-C  guaiFENesin-codeine 100-10 MG/5ML syrup Take 5 mLs by mouth 3 (three) times daily as needed for cough. 12/06/19   Aeneas Longsworth, PA-C  ibuprofen (ADVIL,MOTRIN) 600 MG tablet Take 1 tablet (600 mg total) by mouth every 6 (six) hours as needed. 07/31/16   Isla Pence, MD  norethindrone-ethinyl estradiol (JUNEL 1/20) 1-20 MG-MCG tablet TAKE 1 TABLET BY MOUTH EVERY DAY 03/23/19   [provider]  ondansetron (ZOFRAN) 4 MG tablet Take 1 tablet (4 mg total) by mouth every 6 (six) hours. 08/13/15   Deno Etienne, DO    Allergies    Lisinopril  Review of Systems   Review of Systems  Constitutional: Negative for appetite change, chills and fever.  HENT: Positive for congestion. Negative for  ear pain, rhinorrhea, sneezing and sore throat.   Eyes: Negative for photophobia and visual disturbance.  Respiratory: Positive for cough. Negative for chest tightness, shortness of breath and wheezing.   Cardiovascular: Negative for chest pain and palpitations.  Gastrointestinal: Positive for abdominal pain, nausea and vomiting. Negative for blood in stool, constipation and diarrhea.  Genitourinary: Negative for dysuria, hematuria and urgency.  Musculoskeletal: Positive for myalgias.  Skin: Negative for rash.  Neurological: Negative for dizziness, weakness and light-headedness.    Physical Exam Updated Vital Signs BP (!) 136/92 (BP Location: Right Arm)   Pulse 83   Temp 98.8 F (37.1 C) (Oral)   Resp 19   Ht 5\' 3"  (1.6 m)   Wt 62.6 kg   LMP 11/22/2019   SpO2 99%   BMI 24.45 kg/m   Physical Exam Vitals and nursing note reviewed.  Constitutional:       General: She is not in acute distress.    Appearance: She is well-developed.     Comments: Cough noted on exam.  HENT:     Head: Normocephalic and atraumatic.     Nose: Nose normal.  Eyes:     General: No scleral icterus.       Left eye: No discharge.     Conjunctiva/sclera: Conjunctivae normal.  Cardiovascular:     Rate and Rhythm: Normal rate and regular rhythm.     Heart sounds: Normal heart sounds. No murmur. No friction rub. No gallop.   Pulmonary:     Effort: Pulmonary effort is normal. No respiratory distress.     Breath sounds: Normal breath sounds.  Abdominal:     General: Bowel sounds are normal. There is no distension.     Palpations: Abdomen is soft.     Tenderness: There is no abdominal tenderness. There is no guarding.  Musculoskeletal:        General: Normal range of motion.     Cervical back: Normal range of motion and neck supple.  Skin:    General: Skin is warm and dry.     Findings: No rash.  Neurological:     Mental Status: She is alert.     Motor: No abnormal muscle tone.     Coordination: Coordination normal.     ED Results / Procedures / Treatments   Labs (all labs ordered are listed, but only abnormal results are displayed) Labs Reviewed  SARS CORONAVIRUS 2 BY RT PCR (HOSPITAL ORDER, PERFORMED IN [facility])  COMPREHENSIVE METABOLIC PANEL  CBC WITH DIFFERENTIAL/PLATELET  LIPASE, BLOOD    EKG None  Radiology DG Chest Portable 1 View  Result Date: 12/06/2019 CLINICAL DATA:  Cough with body aches and fatigue. EXAM: PORTABLE CHEST 1 VIEW COMPARISON:  07/31/2016 FINDINGS: 1826 hours. The lungs are clear without focal pneumonia, edema, pneumothorax or pleural effusion. The cardiopericardial silhouette is within normal limits for size. The visualized bony structures of the thorax are intact. IMPRESSION: No active disease. Electronically Signed   By: 09/28/2016 M.D.   On: 12/06/2019 18:48    Procedures Procedures (including  critical care time)  Medications Ordered in ED Medications  ketorolac (TORADOL) 30 MG/ML injection 30 mg (has no administration in time range)  sodium chloride 0.9 % bolus 1,000 mL (1,000 mLs Intravenous New Bag/Given 12/06/19 1854)  famotidine (PEPCID) IVPB 20 mg premix (0 mg Intravenous Stopped 12/06/19 1928)    ED Course  I have reviewed the triage vital signs and the nursing notes.  Pertinent labs &  imaging results that were available during my care of the patient were reviewed by me and considered in my medical decision making (see chart for details).    MDM Rules/Calculators/A&P                      Rim Thatch was evaluated in Emergency Department on 12/06/2019 for the symptoms described in the history of present illness. She was evaluated in the context of the global COVID-19 pandemic, which necessitated consideration that the patient might be at risk for infection with the SARS-CoV-2 virus that causes COVID-19. Institutional protocols and algorithms that pertain to the evaluation of patients at risk for COVID-19 are in a state of rapid change based on information released by regulatory bodies including the CDC and federal and state organizations. These policies and algorithms were followed during the patient's care in the ED.  34 year old female presenting to the ED with a chief complaint of cough.  For the past week has been having cough productive with mucus, nasal congestion, epigastric pain and lower back pain.  She is concerned that the pain could be due to coughing.  She had one episode of posttussive emesis 1 week ago as well as diarrhea.  Her GI symptoms resolved without intervention.  No known Covid exposures.  On exam patient has a cough present.  Abdomen is soft, nontender nondistended.  Vital signs are within normal limits, she is not tachycardic, tachypneic or hypoxic.  Lab work here including CBC, CMP and lipase unremarkable.  Covid test is negative.  Chest x-ray without any  acute findings.  Patient denies possibility of pregnancy.  She was given Pepcid, fluids here with some improvement in her symptoms.  Suspect that her symptoms are viral in nature.  No evidence of pneumonia on chest x-ray.  Will treat with antitussives, continue hydration and follow-up with PCP.  All imaging, if done today, including plain films, CT scans, and ultrasounds, independently reviewed by me, and interpretations confirmed via formal radiology reads.  Patient is hemodynamically stable, in NAD, and able to ambulate in the ED. Evaluation does not show pathology that would require ongoing emergent intervention or inpatient treatment. I explained the diagnosis to the patient. Pain has been managed and has no complaints prior to discharge. Patient is comfortable with above plan and is stable for discharge at this time. All questions were answered prior to disposition. Strict return precautions for returning to the ED were discussed. Encouraged follow up with PCP.   An After Visit Summary was printed and given to the patient.   Portions of this note were generated with Scientist, clinical (histocompatibility and immunogenetics). Dictation errors may occur despite best attempts at proofreading.  Final Clinical Impression(s) / ED Diagnoses Final diagnoses:  Viral URI with cough    Rx / DC Orders ED Discharge Orders         Ordered    benzonatate (TESSALON) 100 MG capsule  Every 8 hours     12/06/19 2020    guaiFENesin-codeine 100-10 MG/5ML syrup  3 times daily PRN     12/06/19 2020           Dietrich Pates, PA-C 12/06/19 2023    Milagros Loll, MD 12/07/19 1311

## 2020-10-11 LAB — HM PAP SMEAR: HM Pap smear: NEGATIVE

## 2021-06-27 IMAGING — CT CT HEAD W/O CM
3 series · 15 of 47 positions shown, 18 images · non-contrast
Comparison: None.

CLINICAL DATA: 33-year-old female with constant headache for 3
days. Pain behind right eye.

EXAM:
CT HEAD WITHOUT CONTRAST
TECHNIQUE: Contiguous axial images were obtained from the base of the skull
through the vertex without intravenous contrast.

[Series 2: head wo · axial · 0.42mm/px · z∈[-510,-384]mm · 9 of 31 slices shown, 12 images]
[im 3/31  brain]
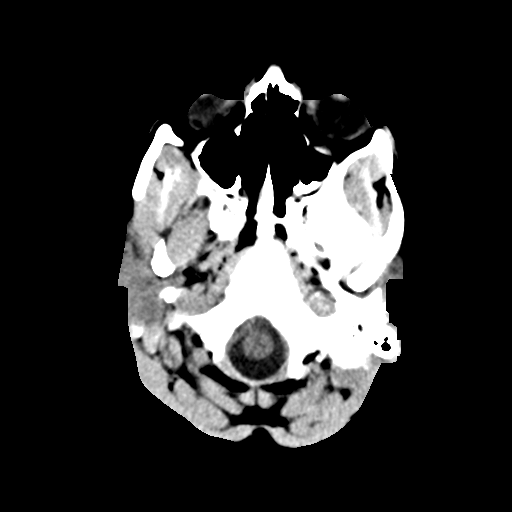
[im 3/31  bone]
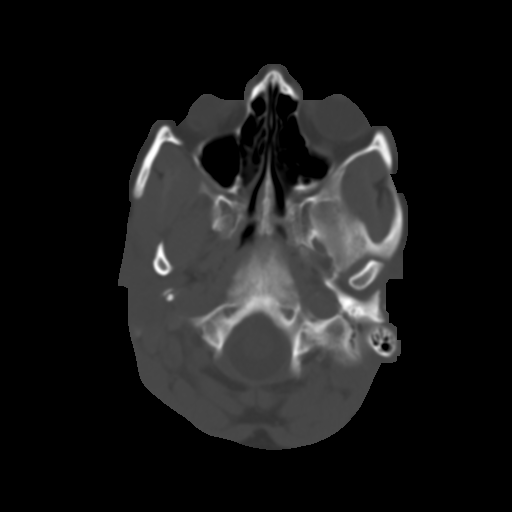
[im 6/31  brain]
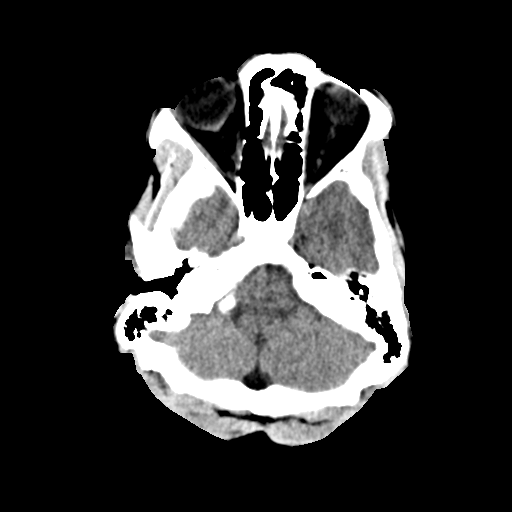
[im 9/31  brain]
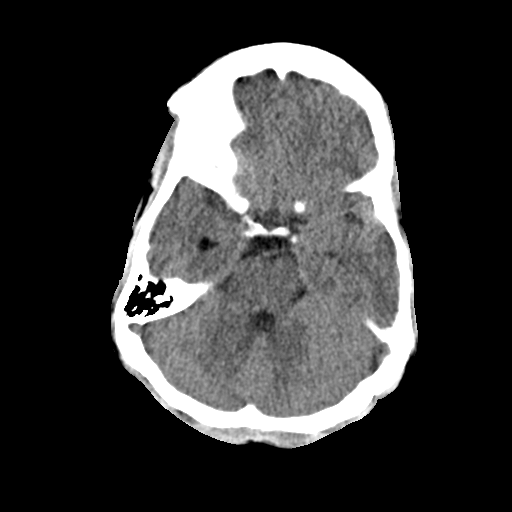
[im 12/31  brain]
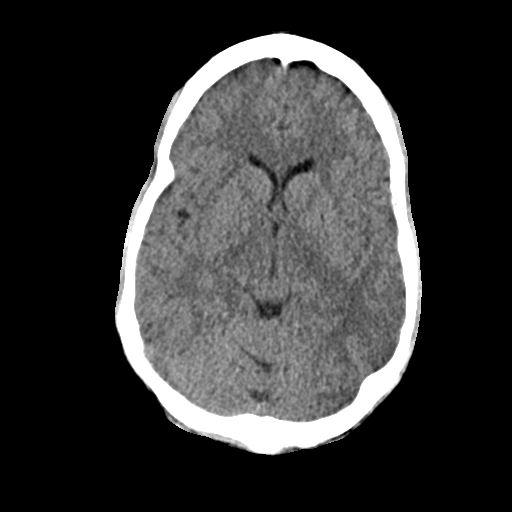
[im 16/31  brain]
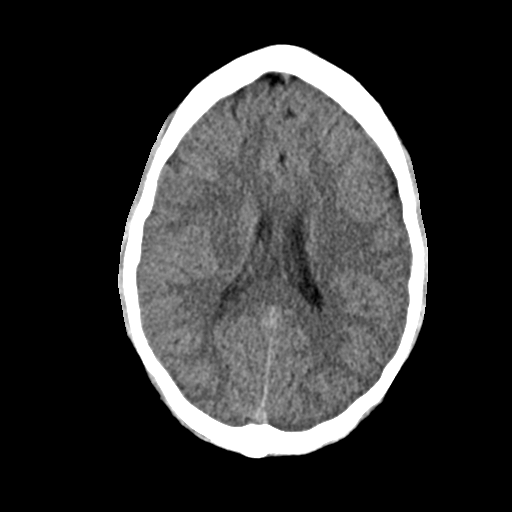
[im 16/31  bone]
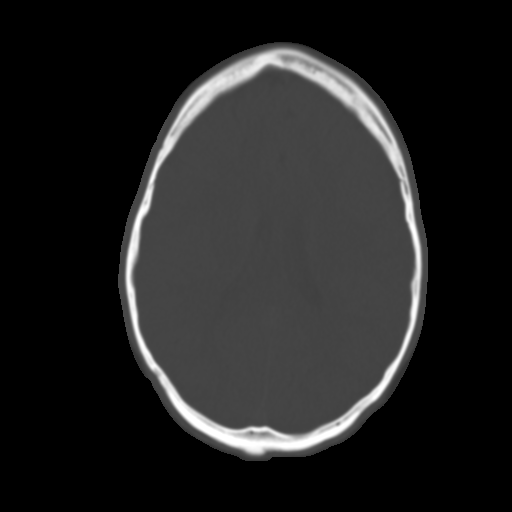
[im 19/31  brain]
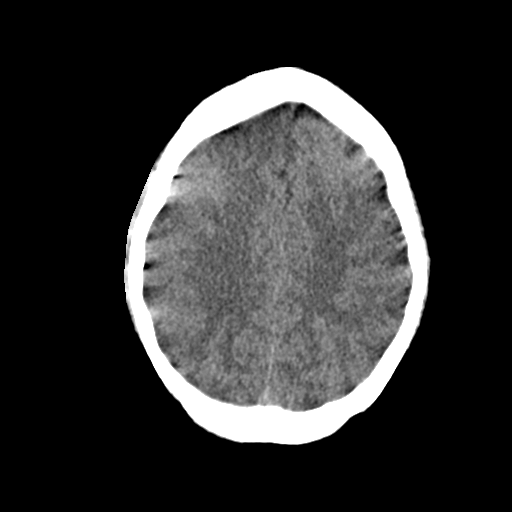
[im 22/31  brain]
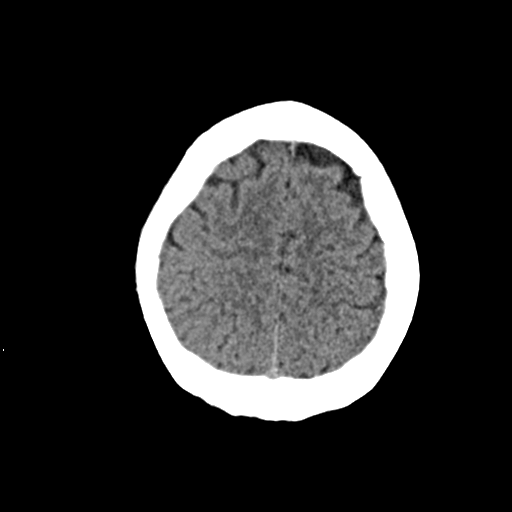
[im 25/31  brain]
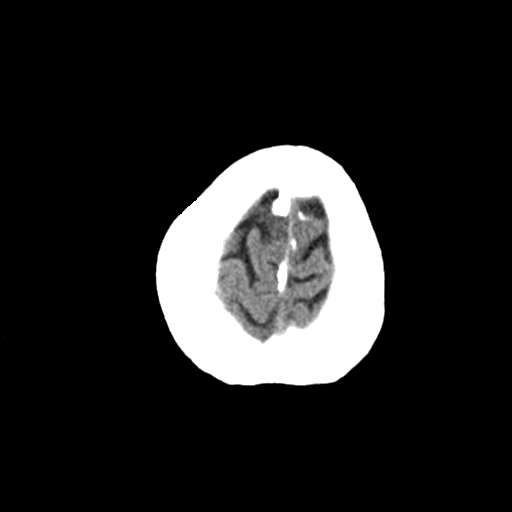
[im 28/31  brain]
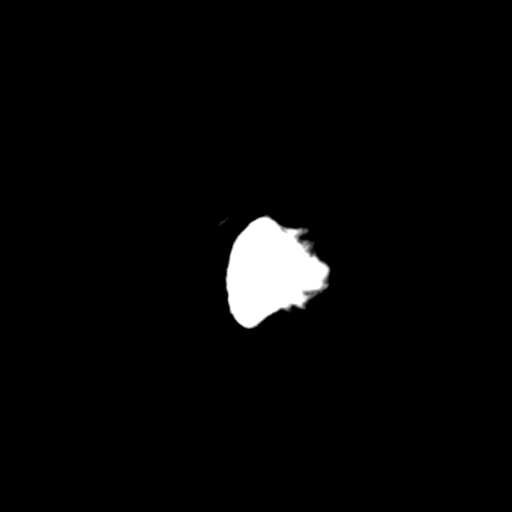
[im 28/31  bone]
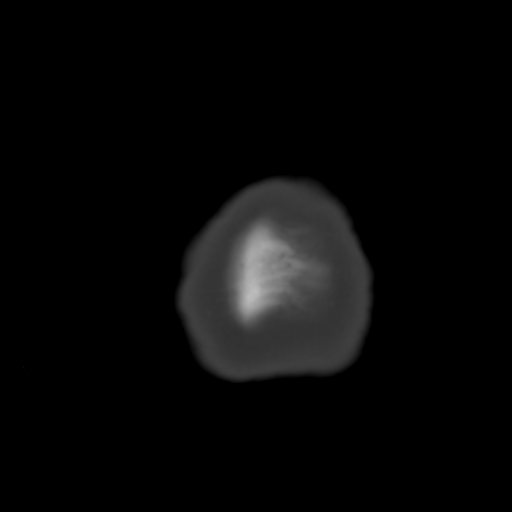

[Series 4: coronal soft · coronal · 0.30mm/px · 3 of 64 slices shown]
[im 22/64  brain]
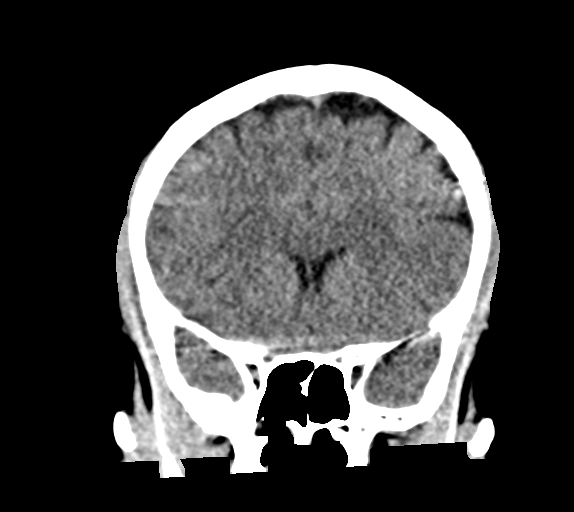
[im 29/64  brain]
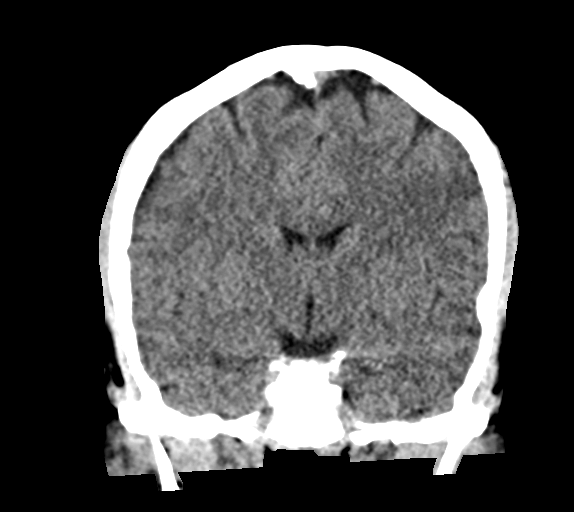
[im 36/64  brain]
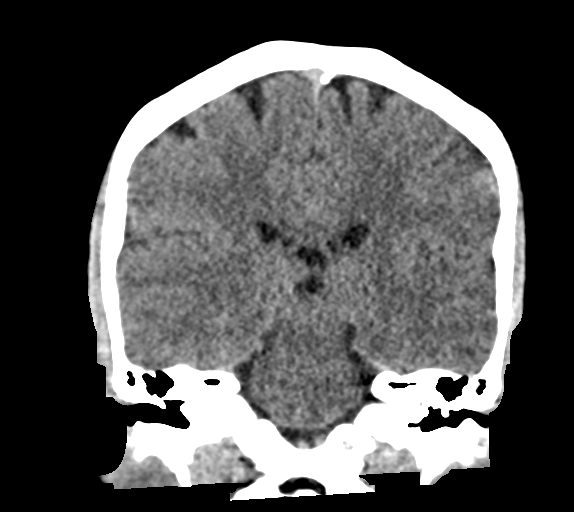

[Series 5: sag soft · sagittal · 0.30mm/px · 3 of 53 slices shown]
[im 18/53  brain]
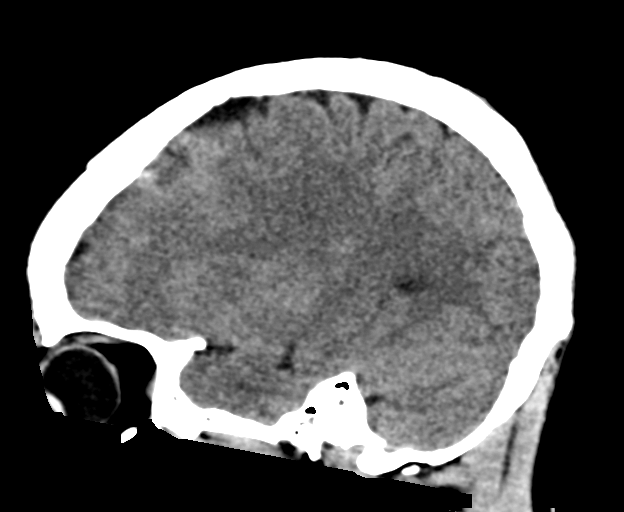
[im 27/53  brain]
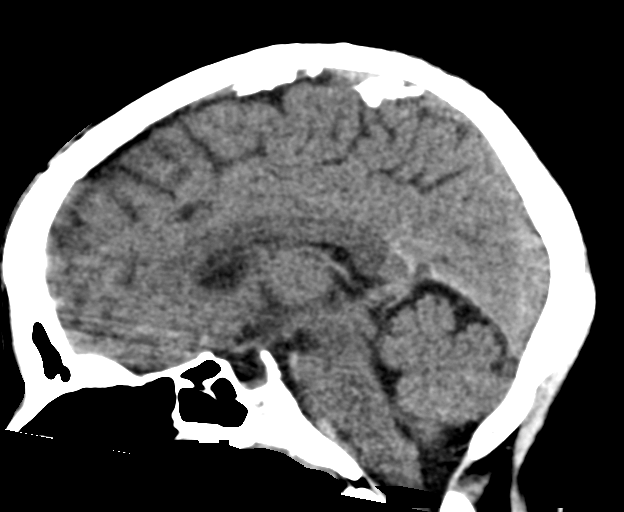
[im 35/53  brain]
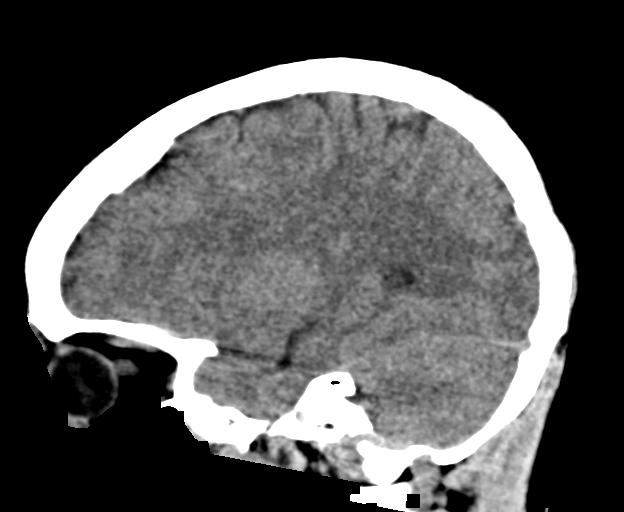

[15 of 47 positions shown; findings below may reference images not displayed]

FINDINGS: Brain: Deep bony sella turcica versus partially empty sella
(sagittal image 27. Normal cerebral volume. Mild streak artifact
intermittently through the brain today (such as on series 2, image
19). No midline shift, ventriculomegaly, mass effect, evidence of
mass lesion, intracranial hemorrhage or evidence of cortically based
acute infarction. Gray-white matter differentiation is within normal
limits throughout the brain.

Vascular: No suspicious intracranial vascular hyperdensity.

Skull: Negative.

Sinuses/Orbits: Visualized paranasal sinuses and mastoids are clear.

Other: Visualized orbits and scalp soft tissues are within normal
limits.
IMPRESSION: 1. Questionable partially empty sella - which can be a normal
variant but also is associated with idiopathic intracranial
hypertension (pseudotumor cerebri).
2. Otherwise negative noncontrast CT appearance of the brain, with
intermittent streak artifact noted.

## 2021-11-04 ENCOUNTER — Ambulatory Visit: Payer: No Typology Code available for payment source | Admitting: Family Medicine

## 2021-12-04 ENCOUNTER — Encounter: Payer: No Typology Code available for payment source | Admitting: Obstetrics

## 2022-07-04 ENCOUNTER — Other Ambulatory Visit: Payer: Self-pay

## 2022-07-04 ENCOUNTER — Emergency Department (HOSPITAL_BASED_OUTPATIENT_CLINIC_OR_DEPARTMENT_OTHER)
Admission: EM | Admit: 2022-07-04 | Discharge: 2022-07-04 | Disposition: A | Discharge status: Home / Self Care | Payer: No Typology Code available for payment source | Attending: Emergency Medicine | Admitting: Emergency Medicine

## 2022-07-04 DIAGNOSIS — H6123 Impacted cerumen, bilateral: Secondary | ICD-10-CM | POA: Diagnosis present

## 2022-07-04 NOTE — ED Provider Notes (Signed)
[facility] EMERGENCY DEPARTMENT Provider Note   CSN: [id number] Arrival date & time: 07/04/22  1732     History  Chief Complaint  Patient presents with   Ear Fullness    Gabriella Monroe is a 36 y.o. female who presents emergency department with concerns for right ear fullness onset this week.  Notes that she has tried over-the-counter Debrox and ear lavage to aid with her symptoms.  Notes that she has had to have her ears irrigated before in the past due to cerumen impaction.  Does not have a ENT specialist at this time.  Denies ear discharge, fever, drainage.  The history is provided by the patient. No language interpreter was used.       Home Medications Prior to Admission medications   Medication Sig Start Date End Date Taking? Authorizing Provider  amoxicillin (AMOXIL) 500 MG capsule Take 1 capsule (500 mg total) by mouth 3 (three) times daily. 07/31/16   Jacalyn Lefevre, MD  benzonatate (TESSALON) 100 MG capsule Take 1 capsule (100 mg total) by mouth every 8 (eight) hours. 12/06/19   Khatri, Hina, PA-C  guaiFENesin-codeine 100-10 MG/5ML syrup Take 5 mLs by mouth 3 (three) times daily as needed for cough. 12/06/19   Khatri, Hina, PA-C  ibuprofen (ADVIL,MOTRIN) 600 MG tablet Take 1 tablet (600 mg total) by mouth every 6 (six) hours as needed. 07/31/16   Jacalyn Lefevre, MD  norethindrone-ethinyl estradiol (JUNEL 1/20) 1-20 MG-MCG tablet TAKE 1 TABLET BY MOUTH EVERY DAY 03/23/19   [provider]  ondansetron (ZOFRAN) 4 MG tablet Take 1 tablet (4 mg total) by mouth every 6 (six) hours. 08/13/15   Melene Plan, DO  valsartan (DIOVAN) 80 MG tablet Take 80 mg by mouth daily.    [provider]      Allergies    Lisinopril    Review of Systems   Review of Systems  All other systems reviewed and are negative.   Physical Exam Updated Vital Signs BP (!) 183/120 (BP Location: Left Arm)   Pulse 85   Temp 98.2 F (36.8 C) (Oral)   Resp 14   Ht 5\' 3"  (1.6 m)    Wt 66.7 kg   LMP 06/28/2022 (Exact Date)   SpO2 100%   BMI 26.04 kg/m  Physical Exam Vitals and nursing note reviewed.  Constitutional:      General: She is not in acute distress.    Appearance: Normal appearance.  HENT:     Right Ear: Ear canal and external ear normal. There is impacted cerumen. No mastoid tenderness.     Left Ear: Ear canal and external ear normal. There is impacted cerumen. No mastoid tenderness.     Ears:     Comments: Bilateral cerumen impaction.  No tenderness to palpation noted to bilateral mastoids.  No overlying skin changes to bilateral external ears, no tenderness to palpation to bilateral canals.  Status post ear lavage: Bilateral TMs visualized without erythema or bulging noted.  Eyes:     General: No scleral icterus.    Extraocular Movements: Extraocular movements intact.  Cardiovascular:     Rate and Rhythm: Normal rate.  Pulmonary:     Effort: Pulmonary effort is normal. No respiratory distress.  Abdominal:     Palpations: Abdomen is soft. There is no mass.     Tenderness: There is no abdominal tenderness.  Musculoskeletal:        General: Normal range of motion.     Cervical back: Neck supple.  Skin:    General: Skin is warm and dry.     Findings: No rash.  Neurological:     Mental Status: She is alert.     Sensory: Sensation is intact.     Motor: Motor function is intact.  Psychiatric:        Behavior: Behavior normal.     ED Results / Procedures / Treatments   Labs (all labs ordered are listed, but only abnormal results are displayed) Labs Reviewed - No data to display  EKG None  Radiology No results found.  Procedures Procedures    Medications Ordered in ED Medications - No data to display  ED Course/ Medical Decision Making/ A&P                           Medical Decision Making  Pt presents with concerns for ear fullness to right ear. History of similar symptoms. Pt afebrile. Pt notes that she is compliant with her  antihypertensives. On exam, pt with Bilateral cerumen impaction.  No tenderness to palpation noted to bilateral mastoids.  No overlying skin changes to bilateral external ears, no tenderness to palpation to bilateral canals. Differential diagnosis includes cerumen impaction, otitis media, otitis externa, mastoiditis.  Medications:  I ordered medication including ear lavage for symptom management Reevaluation of the patient after these medicines and interventions, I reevaluated the patient and found that they have improved Re-evaluation of TMs following ear lavage notes TMs without acute concerning findings bilaterally.  I have reviewed the patients home medicines and have made adjustments as needed   Disposition: Presentation suspicious for bilateral cerumen impaction. Doubt otitis media/externa or mastoiditis at this time. After consideration of the diagnostic results and the patients response to treatment, I feel that the patient would benefit from Discharge home. Pt provided with information for on-call ENT specialist for PRN follow up. Instructed to continue to take her antihypertensives as prescribed. Supportive care measures and strict return precautions discussed with patient at bedside. Pt acknowledges and verbalizes understanding. Pt appears safe for discharge. Follow up as indicated in discharge paperwork.    This chart was dictated using voice recognition software, Dragon. Despite the best efforts of this provider to proofread and correct errors, errors may still occur which can change documentation meaning.   Final Clinical Impression(s) / ED Diagnoses Final diagnoses:  Bilateral impacted cerumen    Rx / DC Orders ED Discharge Orders     None         Dhairya Corales A, PA-C 07/04/22 2040    Blanchie Dessert, MD 07/04/22 2332

## 2022-07-04 NOTE — ED Triage Notes (Signed)
Pt POV steady gait-  C/o throbbing pain to right ear, feeling fullness.

## 2022-07-04 NOTE — Discharge Instructions (Addendum)
It was a pleasure take care of you today!  Your earwax was removed today in the emergency department.  Attached is information for the ENT specialist that you may call as needed for follow-up regarding today's ED visit.  Ensure that she continue taking her antihypertensives.  Return to the emergency department if you are experiencing increasing/worsening symptoms.

## 2022-12-23 DIAGNOSIS — I10 Essential (primary) hypertension: Secondary | ICD-10-CM | POA: Diagnosis not present

## 2023-01-12 DIAGNOSIS — E7800 Pure hypercholesterolemia, unspecified: Secondary | ICD-10-CM | POA: Diagnosis not present

## 2023-01-12 DIAGNOSIS — I10 Essential (primary) hypertension: Secondary | ICD-10-CM | POA: Diagnosis not present

## 2023-01-12 DIAGNOSIS — E559 Vitamin D deficiency, unspecified: Secondary | ICD-10-CM | POA: Diagnosis not present

## 2023-01-12 DIAGNOSIS — R5383 Other fatigue: Secondary | ICD-10-CM | POA: Diagnosis not present

## 2023-01-12 DIAGNOSIS — R030 Elevated blood-pressure reading, without diagnosis of hypertension: Secondary | ICD-10-CM | POA: Diagnosis not present

## 2023-01-12 DIAGNOSIS — Z6827 Body mass index (BMI) 27.0-27.9, adult: Secondary | ICD-10-CM | POA: Diagnosis not present

## 2023-01-12 DIAGNOSIS — R0602 Shortness of breath: Secondary | ICD-10-CM | POA: Diagnosis not present

## 2023-01-12 DIAGNOSIS — R350 Frequency of micturition: Secondary | ICD-10-CM | POA: Diagnosis not present

## 2023-01-12 DIAGNOSIS — Z0000 Encounter for general adult medical examination without abnormal findings: Secondary | ICD-10-CM | POA: Diagnosis not present

## 2023-02-11 DIAGNOSIS — R030 Elevated blood-pressure reading, without diagnosis of hypertension: Secondary | ICD-10-CM | POA: Diagnosis not present

## 2023-02-11 DIAGNOSIS — I10 Essential (primary) hypertension: Secondary | ICD-10-CM | POA: Diagnosis not present

## 2023-02-11 DIAGNOSIS — Z6827 Body mass index (BMI) 27.0-27.9, adult: Secondary | ICD-10-CM | POA: Diagnosis not present

## 2023-02-11 DIAGNOSIS — E7800 Pure hypercholesterolemia, unspecified: Secondary | ICD-10-CM | POA: Diagnosis not present

## 2023-05-25 ENCOUNTER — Encounter: Payer: Self-pay | Admitting: Obstetrics & Gynecology

## 2023-05-25 ENCOUNTER — Ambulatory Visit: Payer: BLUE CROSS/BLUE SHIELD | Admitting: Obstetrics & Gynecology

## 2023-05-25 VITALS — BP 132/89 | HR 95 | Ht 63.0 in | Wt 147.0 lb

## 2023-05-25 DIAGNOSIS — R102 Pelvic and perineal pain: Secondary | ICD-10-CM | POA: Diagnosis not present

## 2023-05-25 DIAGNOSIS — Z30432 Encounter for removal of intrauterine contraceptive device: Secondary | ICD-10-CM

## 2023-05-25 NOTE — Progress Notes (Signed)
    GYNECOLOGY OFFICE PROCEDURE NOTE  Gabriella Monroe is a 37 y.o. G3P0010 here for Paragard IUD removal. This was placed in 2022. Since then, she had worsening pelvic pain (R>>L) especially during her menstrual periods and heavy menstrual bleeding.  Wants this out, plans to use condoms for contraception for now. Last pap smear was on 10/11/20 and was normal.  IUD Removal  Patient identified, informed consent performed, consent signed.   Chaperone present.  Patient was placed in the dorsal lithotomy position, normal external genitalia was noted.  A speculum was placed in the patient's vagina, normal discharge was noted, no lesions. The cervix was visualized, no lesions, no abnormal discharge.  The strings of the IUD were grasped and pulled using ring forceps. The IUD was removed in its entirety.  If patient's symptoms continue after IUD removal, she will need further evaluation with imaging.   Routine preventative health maintenance measures emphasized, patient is aware she is due for annual/pap in March 2025.   Gabriella Collins, MD, FACOG Obstetrician & Gynecologist, [facility] for [employer], [facility]

## 2023-08-17 ENCOUNTER — Telehealth: Payer: Self-pay

## 2023-08-17 NOTE — Telephone Encounter (Signed)
Returned patients phone call to schedule annual. Left voicemail for her to call back.

## 2023-08-18 ENCOUNTER — Other Ambulatory Visit (HOSPITAL_BASED_OUTPATIENT_CLINIC_OR_DEPARTMENT_OTHER): Payer: Self-pay

## 2023-08-18 ENCOUNTER — Encounter: Payer: Self-pay | Admitting: Physician Assistant

## 2023-08-18 ENCOUNTER — Ambulatory Visit: Payer: BLUE CROSS/BLUE SHIELD | Admitting: Physician Assistant

## 2023-08-18 VITALS — BP 138/100 | HR 85 | Temp 98.0°F | Ht 63.0 in | Wt 161.5 lb

## 2023-08-18 DIAGNOSIS — I10 Essential (primary) hypertension: Secondary | ICD-10-CM | POA: Diagnosis not present

## 2023-08-18 DIAGNOSIS — Z23 Encounter for immunization: Secondary | ICD-10-CM | POA: Diagnosis not present

## 2023-08-18 LAB — COMPREHENSIVE METABOLIC PANEL
ALT: 10 U/L (ref 0–35)
AST: 15 U/L (ref 0–37)
Albumin: 4 g/dL (ref 3.5–5.2)
Alkaline Phosphatase: 37 U/L — ABNORMAL LOW (ref 39–117)
BUN: 12 mg/dL (ref 6–23)
CO2: 27 meq/L (ref 19–32)
Calcium: 8.6 mg/dL (ref 8.4–10.5)
Chloride: 107 meq/L (ref 96–112)
Creatinine, Ser: 0.79 mg/dL (ref 0.40–1.20)
GFR: 95.08 mL/min (ref >60.00)
Glucose, Bld: 83 mg/dL (ref 70–99)
Potassium: 4.7 meq/L (ref 3.5–5.1)
Sodium: 138 meq/L (ref 135–145)
Total Bilirubin: 0.4 mg/dL (ref 0.2–1.2)
Total Protein: 6 g/dL (ref 6.0–8.3)

## 2023-08-18 LAB — CBC WITH DIFFERENTIAL/PLATELET
Basophils Absolute: 0 10*3/uL (ref 0.0–0.1)
Basophils Relative: 1 % (ref 0.0–3.0)
Eosinophils Absolute: 0.1 10*3/uL (ref 0.0–0.7)
Eosinophils Relative: 2.9 % (ref 0.0–5.0)
HCT: 35.7 % — ABNORMAL LOW (ref 36.0–46.0)
Hemoglobin: 11.9 g/dL — ABNORMAL LOW (ref 12.0–15.0)
Lymphocytes Relative: 30.9 % (ref 12.0–46.0)
Lymphs Abs: 0.9 10*3/uL (ref 0.7–4.0)
MCHC: 33.4 g/dL (ref 30.0–36.0)
MCV: 94.1 fL (ref 78.0–100.0)
Monocytes Absolute: 0.3 10*3/uL (ref 0.1–1.0)
Monocytes Relative: 8.8 % (ref 3.0–12.0)
Neutro Abs: 1.7 10*3/uL (ref 1.4–7.7)
Neutrophils Relative %: 56.4 % (ref 43.0–77.0)
Platelets: 237 10*3/uL (ref 150.0–400.0)
RBC: 3.8 Mil/uL — ABNORMAL LOW (ref 3.87–5.11)
RDW: 13.6 % (ref 11.5–15.5)
WBC: 3 10*3/uL — ABNORMAL LOW (ref 4.0–10.5)

## 2023-08-18 LAB — LIPID PANEL
Cholesterol: 177 mg/dL (ref 0–200)
HDL: 101 mg/dL (ref >39.00)
LDL Cholesterol: 66 mg/dL (ref 0–99)
NonHDL: 76.34
Total CHOL/HDL Ratio: 2
Triglycerides: 52 mg/dL (ref 0.0–149.0)
VLDL: 10.4 mg/dL (ref 0.0–40.0)

## 2023-08-18 LAB — TSH: TSH: 2.1 u[IU]/mL (ref 0.35–5.50)

## 2023-08-18 MED ORDER — VALSARTAN 80 MG PO TABS
80.0000 mg | ORAL_TABLET | Freq: Every day | ORAL | 1 refills | Status: DC
  Filled 2023-08-18: qty 30, 30d supply, fill #0
  Filled 2023-09-18: qty 30, 30d supply, fill #1

## 2023-08-18 NOTE — Assessment & Plan Note (Signed)
Advised pt to restart valsartan 80 mg. She states it was previously ineffective, advised if she checks her BP at home, ok to increase to 160 mg daily .  Checking cmp today F/b 1 mo

## 2023-08-18 NOTE — Progress Notes (Signed)
New patient visit   Patient: Gabriella Monroe   DOB: [birth year]   38 y.o. Female  MRN: [id number] Visit Date: 08/18/2023  Today's healthcare provider: Alfredia Ferguson, PA-C   Cc. New patient, blood sugar control  Subjective    Gabriella Monroe is a G73P2 38 y.o. female who presents today as a new patient to establish care.  Discussed the use of AI scribe software for clinical note transcription with the patient, who gave verbal consent to proceed.  History of Present Illness   The patient, with a history of hypertension, is seeking a new primary care provider. She reports persistent high blood pressure despite various interventions. She has tried different birth control methods, including IUD, Nexplanon, and the pill, but her blood pressure remained high even with the non-hormonal copper IUD. She has been prescribed Valsartan 80 mg, but has not taken in several weeks. She occasionally checks her blood pressure at home and reports that she can feel when it's elevated. She has not had any issues with high blood pressure at home recently. She has a family history of hypertension on both sides.      Past Medical History:  Diagnosis Date   Eczema    Hypertension    Past Surgical History:  Procedure Laterality Date   WISDOM TOOTH EXTRACTION     Family Status  Relation Name Status   Mother Diane Tenny Craw Alive   Father Tommie Raymond Alive   Brother Heide Scales Alive  No partnership data on file   Family History  Problem Relation Age of Onset   Hypertension Mother    Hypertension Father    Hypertension Brother    Social History   Socioeconomic History   Marital status: Married    Spouse name: Not on file   Number of children: Not on file   Years of education: Not on file   Highest education level: Some college, no degree  Occupational History   Not on file  Tobacco Use   Smoking status: Former    Current packs/day: 0.00    Types: Cigars, Cigarettes    Quit date: 04/17/2021    Years  since quitting: 2.3   Smokeless tobacco: Never  Vaping Use   Vaping status: Never Used  Substance and Sexual Activity   Alcohol use: Yes    Alcohol/week: 2.0 standard drinks of alcohol    Types: 2 Glasses of wine per week    Comment: occ   Drug use: No   Sexual activity: Yes    Birth control/protection: None  Other Topics Concern   Not on file  Social History Narrative   Not on file   Social Drivers of Health   Financial Resource Strain: Low Risk  (08/17/2023)   Overall Financial Resource Strain (CARDIA)    Difficulty of Paying Living Expenses: Not very hard  Food Insecurity: No Food Insecurity (08/17/2023)   Hunger Vital Sign    Worried About Running Out of Food in the Last Year: Never true    Ran Out of Food in the Last Year: Never true  Transportation Needs: No Transportation Needs (08/17/2023)   PRAPARE - Administrator, Civil Service (Medical): No    Lack of Transportation (Non-Medical): No  Physical Activity: Insufficiently Active (08/17/2023)   Exercise Vital Sign    Days of Exercise per Week: 2 days    Minutes of Exercise per Session: 20 min  Stress: Stress Concern Present (08/17/2023)   [employer] of Occupational Health -  Occupational Stress Questionnaire    Feeling of Stress : To some extent  Social Connections: Moderately Integrated (08/17/2023)   Social Connection and Isolation Panel [NHANES]    Frequency of Communication with Friends and Family: More than three times a week    Frequency of Social Gatherings with Friends and Family: Twice a week    Attends Religious Services: More than 4 times per year    Active Member of [employer] or Organizations: No    Attends Engineer, structural: Not on file    Marital Status: Married   Outpatient Medications Prior to Visit  Medication Sig   [DISCONTINUED] valsartan (DIOVAN) 80 MG tablet Take 80 mg by mouth daily.   [DISCONTINUED] valsartan-hydrochlorothiazide (DIOVAN-HCT) 80-12.5 MG tablet Take 1  tablet by mouth daily.   No facility-administered medications prior to visit.   Allergies  Allergen Reactions   Lisinopril     Immunization History  Administered Date(s) Administered   Influenza,inj,Quad PF,6+ Mos 04/28/2016   PFIZER(Purple Top)SARS-COV-2 Vaccination 12/24/2019, 01/14/2020   Tdap 08/18/2023    Health Maintenance  Topic Date Due   HIV Screening  Never done   Hepatitis C Screening  Never done   COVID-19 Vaccine (3 - 2024-25 season) 03/22/2023   Cervical Cancer Screening (HPV/Pap Cotest)  10/12/2023   INFLUENZA VACCINE  10/19/2023 (Originally 02/19/2023)   DTaP/Tdap/Td (2 - Td or Tdap) 08/17/2033   HPV VACCINES  Aged Out    Patient Care Team: Alfredia Ferguson, PA-C as PCP - General (Physician Assistant)  Review of Systems  Constitutional:  Negative for fatigue and fever.  Respiratory:  Negative for cough and shortness of breath.   Cardiovascular:  Negative for chest pain and leg swelling.  Gastrointestinal:  Negative for abdominal pain.  Neurological:  Negative for dizziness and headaches.        Objective    BP (!) 138/100   Pulse 85   Temp 98 F (36.7 C) (Oral)   Ht 5\' 3"  (1.6 m)   Wt 161 lb 8 oz (73.3 kg)   LMP 08/10/2023   SpO2 100%   BMI 28.61 kg/m     Physical Exam Constitutional:      General: She is awake.     Appearance: She is well-developed.  HENT:     Head: Normocephalic.  Eyes:     Conjunctiva/sclera: Conjunctivae normal.  Cardiovascular:     Rate and Rhythm: Normal rate and regular rhythm.     Heart sounds: Normal heart sounds.  Pulmonary:     Effort: Pulmonary effort is normal.     Breath sounds: Normal breath sounds.  Skin:    General: Skin is warm.  Neurological:     Mental Status: She is alert and oriented to person, place, and time.  Psychiatric:        Attention and Perception: Attention normal.        Mood and Affect: Mood normal.        Speech: Speech normal.        Behavior: Behavior is cooperative.     Depression Screen     No data to display         No results found for any visits on 08/18/23.  Assessment & Plan     Primary hypertension Assessment & Plan: Advised pt to restart valsartan 80 mg. She states it was previously ineffective, advised if she checks her BP at home, ok to increase to 160 mg daily .  Checking cmp today F/b 1 mo  Orders: -     CBC with Differential/Platelet -     Comprehensive metabolic panel -     Lipid panel -     TSH -     Valsartan; Take 1 tablet (80 mg total) by mouth daily.  Dispense: 90 tablet; Refill: 1  Immunization due -     Tdap vaccine greater than or equal to 7yo IM     Return in about 4 weeks (around 09/15/2023) for hypertension.      Alfredia Ferguson, PA-C  [facility] at [facility] [phone] (phone) [phone] (fax)  [facility]

## 2023-08-19 ENCOUNTER — Encounter: Payer: Self-pay | Admitting: Physician Assistant

## 2023-08-19 ENCOUNTER — Other Ambulatory Visit: Payer: Self-pay | Admitting: Physician Assistant

## 2023-08-19 DIAGNOSIS — D649 Anemia, unspecified: Secondary | ICD-10-CM

## 2023-09-18 ENCOUNTER — Ambulatory Visit: Payer: BLUE CROSS/BLUE SHIELD | Admitting: Physician Assistant

## 2023-09-25 NOTE — Progress Notes (Signed)
      Established patient visit   Patient: Gabriella Monroe   DOB: 1986/04/23   38 y.o. Female  MRN: [id number] Visit Date: 09/28/2023  Today's healthcare provider: Alfredia Ferguson, PA-C   Cc. Htn f/u  Subjective     Hypertension, follow-up  BP Readings from Last 3 Encounters:  09/28/23 (!) 152/102  08/18/23 (!) 138/100  05/25/23 132/89   Wt Readings from Last 3 Encounters:  09/28/23 160 lb (72.6 kg)  08/18/23 161 lb 8 oz (73.3 kg)  05/25/23 147 lb (66.7 kg)     She was last seen for hypertension around a month ago, started on valsartan 80 mg. Reports feeling better.  Outside blood pressures are not being checked.  Pertinent labs Lab Results  Component Value Date   CHOL 177 08/18/2023   HDL 101.00 08/18/2023   LDLCALC 66 08/18/2023   TRIG 52.0 08/18/2023   CHOLHDL 2 08/18/2023   Lab Results  Component Value Date   NA 138 08/18/2023   K 4.7 08/18/2023   CREATININE 0.79 08/18/2023   GFRNONAA >60 12/06/2019   GLUCOSE 83 08/18/2023   TSH 2.10 08/18/2023     The ASCVD Risk score (Arnett DK, et al., 2019) failed to calculate for the following reasons:   The 2019 ASCVD risk score is only valid for ages 70 to 21  ---------------------------------------------------------------------------------------------------  Medications: Outpatient Medications Prior to Visit  Medication Sig   valsartan (DIOVAN) 80 MG tablet Take 160 mg by mouth daily.   [DISCONTINUED] valsartan (DIOVAN) 80 MG tablet Take 1 tablet (80 mg total) by mouth daily.   No facility-administered medications prior to visit.    Review of Systems  Constitutional:  Negative for fatigue and fever.  Respiratory:  Negative for cough and shortness of breath.   Cardiovascular:  Negative for chest pain and leg swelling.  Gastrointestinal:  Negative for abdominal pain.  Neurological:  Negative for dizziness and headaches.       Objective    BP (!) 152/102   Pulse 96   Ht 5\' 3"  (1.6 m)   Wt 160 lb (72.6 kg)    BMI 28.34 kg/m    Physical Exam Vitals reviewed.  Constitutional:      Appearance: She is not ill-appearing.  HENT:     Head: Normocephalic.  Eyes:     Conjunctiva/sclera: Conjunctivae normal.  Cardiovascular:     Rate and Rhythm: Normal rate.  Pulmonary:     Effort: Pulmonary effort is normal. No respiratory distress.  Neurological:     Mental Status: She is alert and oriented to person, place, and time.  Psychiatric:        Mood and Affect: Mood normal.        Behavior: Behavior normal.     No results found for any visits on 09/28/23.  Assessment & Plan    Primary hypertension Assessment & Plan: Increase valsartan to 160 mg daily. F/b 1 mo   Anemia, unspecified type Assessment & Plan: Mild, repeat cbc/iron panel  Orders: -     CBC with Differential/Platelet -     IBC + Ferritin   Return in about 4 weeks (around 10/26/2023) for hypertension.       Alfredia Ferguson, PA-C  [facility] at [facility] [phone] (phone) [phone] (fax)  [facility]

## 2023-09-28 ENCOUNTER — Ambulatory Visit: Payer: BLUE CROSS/BLUE SHIELD | Admitting: Physician Assistant

## 2023-09-28 ENCOUNTER — Encounter: Payer: Self-pay | Admitting: Physician Assistant

## 2023-09-28 VITALS — BP 152/102 | HR 96 | Ht 63.0 in | Wt 160.0 lb

## 2023-09-28 DIAGNOSIS — I10 Essential (primary) hypertension: Secondary | ICD-10-CM

## 2023-09-28 DIAGNOSIS — D649 Anemia, unspecified: Secondary | ICD-10-CM | POA: Insufficient documentation

## 2023-09-28 LAB — CBC WITH DIFFERENTIAL/PLATELET
Basophils Absolute: 0.1 10*3/uL (ref 0.0–0.1)
Basophils Relative: 1.5 % (ref 0.0–3.0)
Eosinophils Absolute: 0.1 10*3/uL (ref 0.0–0.7)
Eosinophils Relative: 3.2 % (ref 0.0–5.0)
HCT: 38.9 % (ref 36.0–46.0)
Hemoglobin: 13.2 g/dL (ref 12.0–15.0)
Lymphocytes Relative: 29.6 % (ref 12.0–46.0)
Lymphs Abs: 1 10*3/uL (ref 0.7–4.0)
MCHC: 34 g/dL (ref 30.0–36.0)
MCV: 94.3 fl (ref 78.0–100.0)
Monocytes Absolute: 0.3 10*3/uL (ref 0.1–1.0)
Monocytes Relative: 9.2 % (ref 3.0–12.0)
Neutro Abs: 1.9 10*3/uL (ref 1.4–7.7)
Neutrophils Relative %: 56.5 % (ref 43.0–77.0)
Platelets: 230 10*3/uL (ref 150.0–400.0)
RBC: 4.12 Mil/uL (ref 3.87–5.11)
RDW: 13.8 % (ref 11.5–15.5)
WBC: 3.4 10*3/uL — ABNORMAL LOW (ref 4.0–10.5)

## 2023-09-28 LAB — IBC + FERRITIN
Ferritin: 32.2 ng/mL (ref 10.0–291.0)
Iron: 85 ug/dL (ref 42–145)
Saturation Ratios: 27.6 % (ref 20.0–50.0)
TIBC: 308 ug/dL (ref 250.0–450.0)
Transferrin: 220 mg/dL (ref 212.0–360.0)

## 2023-09-28 NOTE — Assessment & Plan Note (Signed)
 Increase valsartan to 160 mg daily. F/b 1 mo

## 2023-09-28 NOTE — Assessment & Plan Note (Signed)
 Mild, repeat cbc/iron panel

## 2023-09-29 ENCOUNTER — Encounter: Payer: Self-pay | Admitting: Physician Assistant

## 2023-10-14 ENCOUNTER — Other Ambulatory Visit: Payer: Self-pay

## 2023-10-14 ENCOUNTER — Other Ambulatory Visit (HOSPITAL_BASED_OUTPATIENT_CLINIC_OR_DEPARTMENT_OTHER): Payer: Self-pay

## 2023-10-14 ENCOUNTER — Other Ambulatory Visit: Payer: Self-pay | Admitting: Physician Assistant

## 2023-10-14 MED ORDER — VALSARTAN 80 MG PO TABS
160.0000 mg | ORAL_TABLET | Freq: Every day | ORAL | 0 refills | Status: DC
Start: 2023-10-14 — End: 2023-11-17
  Filled 2023-10-14: qty 60, 30d supply, fill #0

## 2023-10-29 ENCOUNTER — Other Ambulatory Visit (HOSPITAL_COMMUNITY)
Admission: RE | Admit: 2023-10-29 | Discharge: 2023-10-29 | Disposition: A | Discharge status: Home / Self Care | Source: Ambulatory Visit | Attending: Family Medicine | Admitting: Family Medicine

## 2023-10-29 ENCOUNTER — Ambulatory Visit: Admitting: Physician Assistant

## 2023-10-29 ENCOUNTER — Ambulatory Visit (INDEPENDENT_AMBULATORY_CARE_PROVIDER_SITE_OTHER): Admitting: Family Medicine

## 2023-10-29 VITALS — BP 138/76 | HR 111 | Ht 63.0 in | Wt 165.0 lb

## 2023-10-29 DIAGNOSIS — Z01419 Encounter for gynecological examination (general) (routine) without abnormal findings: Secondary | ICD-10-CM | POA: Diagnosis not present

## 2023-10-29 DIAGNOSIS — Z1339 Encounter for screening examination for other mental health and behavioral disorders: Secondary | ICD-10-CM | POA: Diagnosis not present

## 2023-10-29 MED ORDER — NORETHINDRONE 0.35 MG PO TABS: 1.0000 | ORAL_TABLET | Freq: Every day | ORAL | 3 refills | Status: AC

## 2023-10-29 NOTE — Progress Notes (Signed)
 ANNUAL EXAM Patient name: Gabriella Monroe MRN [id number]  Date of birth: 1986/05/04 Chief Complaint:   Annual Exam  History of Present Illness:   Gabriella Monroe is a 38 y.o.  G3P0010  female  being seen today for a routine annual exam.  Current complaints: would like to be on birth control. Does not want IUD or nexplanon.   Patient's last menstrual period was 10/16/2023 (exact date).    Last pap unsure. Results were:  neg per patient . H/O abnormal pap: no Last mammogram: n/a     10/29/2023    3:03 PM  Depression screen PHQ 2/9  Decreased Interest 0  Down, Depressed, Hopeless 0  PHQ - 2 Score 0  Altered sleeping 1  Tired, decreased energy 0  Change in appetite 0  Feeling bad or failure about yourself  0  Trouble concentrating 0  Moving slowly or fidgety/restless 0  Suicidal thoughts 0  PHQ-9 Score 1        10/29/2023    3:03 PM  GAD 7 : Generalized Anxiety Score  Nervous, Anxious, on Edge 0  Control/stop worrying 0  Worry too much - different things 0  Trouble relaxing 0  Restless 0  Easily annoyed or irritable 0  Afraid - awful might happen 0  Total GAD 7 Score 0     Review of Systems:   Pertinent items are noted in HPI Denies any headaches, blurred vision, fatigue, shortness of breath, chest pain, abdominal pain, abnormal vaginal discharge/itching/odor/irritation, problems with periods, bowel movements, urination, or intercourse unless otherwise stated above. Pertinent History Reviewed:  Reviewed past medical,surgical, social and family history.  Reviewed problem list, medications and allergies. Physical Assessment:   Vitals:   10/29/23 1456 10/29/23 1501  BP: (!) 143/98 138/76  Pulse: (!) 102 (!) 111  Weight: 165 lb (74.8 kg)   Height: 5\' 3"  (1.6 m)   Body mass index is 29.23 kg/m.        Physical Examination:   General appearance - well appearing, and in no distress  Mental status - alert, oriented to person, place, and time  Psych:  She has a normal  mood and affect  Skin - warm and dry, normal color, no suspicious lesions noted  Chest - effort normal, all lung fields clear to auscultation bilaterally  Heart - normal rate and regular rhythm  Neck:  midline trachea, no thyromegaly or nodules  Breasts - breasts appear normal, no suspicious masses, no skin or nipple changes or axillary nodes  Abdomen - soft, nontender, nondistended, no masses or organomegaly  Pelvic - VULVA: normal appearing vulva with no masses, tenderness or lesions  VAGINA: normal appearing vagina with normal color and discharge, no lesions  CERVIX: normal appearing cervix without discharge or lesions, no CMT  Thin prep pap is done with HR HPV cotesting  UTERUS: uterus is felt to be normal size, shape, consistency and nontender   ADNEXA: No adnexal masses or tenderness noted.  Extremities:  No swelling or varicosities noted  Chaperone present for exam  Assessment & Plan:  1. Well woman exam with routine gynecological exam (Primary) PAP today.  Micronor for birth control - Cytology - PAP( [place])   Labs/procedures today:   No orders of the defined types were placed in this encounter.   Meds:  Meds ordered this encounter  Medications   norethindrone (MICRONOR) 0.35 MG tablet    Sig: Take 1 tablet (0.35 mg total) by mouth daily.    Dispense:  90 tablet    Refill:  3    Follow-up: No follow-ups on file.  [name], DO 10/29/2023 4:54 PM

## 2023-11-05 LAB — CYTOLOGY - PAP
Adequacy: ABSENT
Comment: NEGATIVE
Diagnosis: NEGATIVE
High risk HPV: NEGATIVE

## 2023-11-06 ENCOUNTER — Encounter: Payer: Self-pay | Admitting: Family Medicine

## 2023-11-10 ENCOUNTER — Ambulatory Visit: Admitting: Physician Assistant

## 2023-11-16 NOTE — Progress Notes (Unsigned)
      Established patient visit   Patient: Gabriella Monroe   DOB: March 26, 1986   38 y.o. Female  MRN: [id number] Visit Date: 11/17/2023  Today's healthcare provider: Trenton Frock, PA-C   No chief complaint on file.  Subjective     Hypertension, follow-up  BP Readings from Last 3 Encounters:  10/29/23 138/76  09/28/23 (!) 152/102  08/18/23 (!) 138/100   Wt Readings from Last 3 Encounters:  10/29/23 165 lb (74.8 kg)  09/28/23 160 lb (72.6 kg)  08/18/23 161 lb 8 oz (73.3 kg)     She was last seen for hypertension {NUMBERS 1-12:18279} {days/wks/mos/yrs:310907} ago.  BP at that visit was ***. Management since that visit includes ***.  She reports {excellent/good/fair/poor:19665} compliance with treatment. She {is/is not:9024} having side effects. {document side effects if present:1} She is following a {diet:21022986} diet. She {is/is not:9024} exercising. She {does/does not:200015} smoke.  Use of agents associated with hypertension: {bp agents assoc with hypertension:511::"none"}.   Outside blood pressures are {***enter patient reported home BP readings, or 'not being checked':1}. Symptoms: {Yes/No:20286} chest pain {Yes/No:20286} chest pressure  {Yes/No:20286} palpitations {Yes/No:20286} syncope  {Yes/No:20286} dyspnea {Yes/No:20286} orthopnea  {Yes/No:20286} paroxysmal nocturnal dyspnea {Yes/No:20286} lower extremity edema   Pertinent labs Lab Results  Component Value Date   CHOL 177 08/18/2023   HDL 101.00 08/18/2023   LDLCALC 66 08/18/2023   TRIG 52.0 08/18/2023   CHOLHDL 2 08/18/2023   Lab Results  Component Value Date   NA 138 08/18/2023   K 4.7 08/18/2023   CREATININE 0.79 08/18/2023   GFRNONAA >60 12/06/2019   GLUCOSE 83 08/18/2023   TSH 2.10 08/18/2023     The ASCVD Risk score (Arnett DK, et al., 2019) failed to calculate for the following reasons:   The 2019 ASCVD risk score is only valid for ages 74 to  67  ---------------------------------------------------------------------------------------------------   Medications: Outpatient Medications Prior to Visit  Medication Sig   norethindrone  (MICRONOR ) 0.35 MG tablet Take 1 tablet (0.35 mg total) by mouth daily.   valsartan  (DIOVAN ) 80 MG tablet Take 2 tablets (160 mg total) by mouth daily.   No facility-administered medications prior to visit.    Review of Systems {Insert previous labs (optional):23779} {See past labs  Heme  Chem  Endocrine  Serology  Results Review (optional):1}   Objective    LMP 10/16/2023 (Exact Date)  {Insert last BP/Wt (optional):23777}{See vitals history (optional):1}  Physical Exam  ***  No results found for any visits on 11/17/23.  Assessment & Plan    There are no diagnoses linked to this encounter.  ***  No follow-ups on file.       Trenton Frock, PA-C  [facility] at [facility] [phone] (phone) [phone] (fax)  [facility]

## 2023-11-17 ENCOUNTER — Encounter: Payer: Self-pay | Admitting: Physician Assistant

## 2023-11-17 ENCOUNTER — Ambulatory Visit: Admitting: Physician Assistant

## 2023-11-17 VITALS — BP 138/88 | HR 102 | Ht 63.0 in | Wt 163.8 lb

## 2023-11-17 DIAGNOSIS — I10 Essential (primary) hypertension: Secondary | ICD-10-CM

## 2023-11-17 DIAGNOSIS — N939 Abnormal uterine and vaginal bleeding, unspecified: Secondary | ICD-10-CM

## 2023-11-17 MED ORDER — VALSARTAN 160 MG PO TABS: 160.0000 mg | ORAL_TABLET | Freq: Every day | ORAL | 3 refills | Status: AC

## 2023-11-17 NOTE — Assessment & Plan Note (Addendum)
 Better control Cont valsartan  160 mg  - Encourage lifestyle modifications including increased physical activity and dietary changes. F/b 3 mo

## 2024-02-16 ENCOUNTER — Ambulatory Visit: Admitting: Physician Assistant

## 2024-02-16 NOTE — Progress Notes (Deleted)
      Established patient visit   Patient: Gabriella Monroe   DOB: [birth year]   38 y.o. Female  MRN: [id number] Visit Date: 02/17/2024  Today's healthcare provider: Manuelita Flatness, PA-C   No chief complaint on file.  Subjective     Hypertension, follow-up  BP Readings from Last 3 Encounters:  11/17/23 138/88  10/29/23 138/76  09/28/23 (!) 152/102   Wt Readings from Last 3 Encounters:  11/17/23 163 lb 12.8 oz (74.3 kg)  10/29/23 165 lb (74.8 kg)  09/28/23 160 lb (72.6 kg)     She was last seen for hypertension {NUMBERS 1-12:18279} {days/wks/mos/yrs:310907} ago.  BP at that visit was ***. Management since that visit includes ***.  She reports {excellent/good/fair/poor:19665} compliance with treatment. She {is/is not:9024} having side effects. {document side effects if present:1} She is following a {diet:21022986} diet. She {is/is not:9024} exercising. She {does/does not:200015} smoke.  Use of agents associated with hypertension: {bp agents assoc with hypertension:511::none}.   Outside blood pressures are {***enter patient reported home BP readings, or 'not being checked':1}. Symptoms: {Yes/No:20286} chest pain {Yes/No:20286} chest pressure  {Yes/No:20286} palpitations {Yes/No:20286} syncope  {Yes/No:20286} dyspnea {Yes/No:20286} orthopnea  {Yes/No:20286} paroxysmal nocturnal dyspnea {Yes/No:20286} lower extremity edema   Pertinent labs Lab Results  Component Value Date   CHOL 177 08/18/2023   HDL 101.00 08/18/2023   LDLCALC 66 08/18/2023   TRIG 52.0 08/18/2023   CHOLHDL 2 08/18/2023   Lab Results  Component Value Date   NA 138 08/18/2023   K 4.7 08/18/2023   CREATININE 0.79 08/18/2023   GFRNONAA >60 12/06/2019   GLUCOSE 83 08/18/2023   TSH 2.10 08/18/2023     The ASCVD Risk score (Arnett DK, et al., 2019) failed to calculate for the following reasons:   The 2019 ASCVD risk score is only valid for ages 39 to  3  ---------------------------------------------------------------------------------------------------   Medications: Outpatient Medications Prior to Visit  Medication Sig   norethindrone  (MICRONOR ) 0.35 MG tablet Take 1 tablet (0.35 mg total) by mouth daily.   valsartan  (DIOVAN ) 160 MG tablet Take 1 tablet (160 mg total) by mouth daily.   No facility-administered medications prior to visit.    Review of Systems {Insert previous labs (optional):23779} {See past labs  Heme  Chem  Endocrine  Serology  Results Review (optional):1}   Objective    There were no vitals taken for this visit. {Insert last BP/Wt (optional):23777}{See vitals history (optional):1}  Physical Exam  ***  No results found for any visits on 02/17/24.  Assessment & Plan    There are no diagnoses linked to this encounter.  ***  No follow-ups on file.       Manuelita Flatness, PA-C  [facility] at [facility] [phone] (phone) [phone] (fax)  [facility]

## 2024-02-17 ENCOUNTER — Ambulatory Visit: Admitting: Physician Assistant

## 2024-02-17 DIAGNOSIS — I10 Essential (primary) hypertension: Secondary | ICD-10-CM
